# Patient Record
Sex: Female | Born: 1960
Health system: Southern US, Community
[De-identification: ages and names within clinical notes are randomized; demographics above are authoritative.]

## PROBLEM LIST (undated history)

## (undated) DIAGNOSIS — I10 Essential (primary) hypertension: Secondary | ICD-10-CM

## (undated) DIAGNOSIS — F32A Depression, unspecified: Secondary | ICD-10-CM

## (undated) DIAGNOSIS — R0683 Snoring: Secondary | ICD-10-CM

## (undated) DIAGNOSIS — T8859XA Other complications of anesthesia, initial encounter: Secondary | ICD-10-CM

## (undated) DIAGNOSIS — F329 Major depressive disorder, single episode, unspecified: Secondary | ICD-10-CM

## (undated) DIAGNOSIS — T4145XA Adverse effect of unspecified anesthetic, initial encounter: Secondary | ICD-10-CM

## (undated) HISTORY — PX: BREAST BIOPSY: SHX20

## (undated) HISTORY — PX: TONSILLECTOMY AND ADENOIDECTOMY: SUR1326

## (undated) HISTORY — PX: I & D EXTREMITY: SHX5045

## (undated) HISTORY — DX: Essential (primary) hypertension: I10

## (undated) HISTORY — PX: BREAST CYST ASPIRATION: SHX578

---

## 1997-09-01 ENCOUNTER — Ambulatory Visit (HOSPITAL_COMMUNITY): Admission: RE | Admit: 1997-09-01 | Discharge: 1997-09-01 | Payer: Self-pay | Admitting: Obstetrics and Gynecology

## 1997-10-31 ENCOUNTER — Inpatient Hospital Stay (HOSPITAL_COMMUNITY): Admission: AD | Admit: 1997-10-31 | Discharge: 1997-10-31 | Payer: Self-pay | Admitting: Obstetrics and Gynecology

## 1997-11-04 ENCOUNTER — Ambulatory Visit (HOSPITAL_COMMUNITY): Admission: RE | Admit: 1997-11-04 | Discharge: 1997-11-04 | Payer: Self-pay | Admitting: Obstetrics and Gynecology

## 1997-12-23 ENCOUNTER — Ambulatory Visit (HOSPITAL_COMMUNITY): Admission: RE | Admit: 1997-12-23 | Discharge: 1997-12-23 | Payer: Self-pay | Admitting: Obstetrics and Gynecology

## 1997-12-23 ENCOUNTER — Ambulatory Visit (HOSPITAL_COMMUNITY): Admission: RE | Admit: 1997-12-23 | Discharge: 1997-12-23 | Payer: Self-pay | Admitting: Obstetrics & Gynecology

## 1998-01-13 ENCOUNTER — Encounter: Payer: Self-pay | Admitting: Obstetrics and Gynecology

## 1998-01-13 ENCOUNTER — Inpatient Hospital Stay (HOSPITAL_COMMUNITY): Admission: AD | Admit: 1998-01-13 | Discharge: 1998-01-20 | Payer: Self-pay | Admitting: Obstetrics and Gynecology

## 1998-02-27 ENCOUNTER — Other Ambulatory Visit: Admission: RE | Admit: 1998-02-27 | Discharge: 1998-02-27 | Payer: Self-pay | Admitting: Obstetrics and Gynecology

## 1998-12-29 ENCOUNTER — Encounter: Payer: Self-pay | Admitting: Obstetrics and Gynecology

## 1998-12-29 ENCOUNTER — Ambulatory Visit (HOSPITAL_COMMUNITY): Admission: RE | Admit: 1998-12-29 | Discharge: 1998-12-29 | Payer: Self-pay | Admitting: Obstetrics and Gynecology

## 1999-11-24 ENCOUNTER — Encounter: Payer: Self-pay | Admitting: Gastroenterology

## 1999-11-24 ENCOUNTER — Encounter: Admission: RE | Admit: 1999-11-24 | Discharge: 1999-11-24 | Payer: Self-pay | Admitting: Gastroenterology

## 2000-03-21 ENCOUNTER — Encounter: Admission: RE | Admit: 2000-03-21 | Discharge: 2000-06-19 | Payer: Self-pay | Admitting: Obstetrics and Gynecology

## 2000-06-15 ENCOUNTER — Other Ambulatory Visit: Admission: RE | Admit: 2000-06-15 | Discharge: 2000-06-15 | Payer: Self-pay | Admitting: Obstetrics and Gynecology

## 2001-07-06 ENCOUNTER — Other Ambulatory Visit: Admission: RE | Admit: 2001-07-06 | Discharge: 2001-07-06 | Payer: Self-pay | Admitting: Obstetrics and Gynecology

## 2001-12-03 ENCOUNTER — Ambulatory Visit (HOSPITAL_COMMUNITY): Admission: RE | Admit: 2001-12-03 | Discharge: 2001-12-03 | Payer: Self-pay | Admitting: Family Medicine

## 2001-12-03 ENCOUNTER — Encounter: Payer: Self-pay | Admitting: Family Medicine

## 2002-07-02 ENCOUNTER — Other Ambulatory Visit: Admission: RE | Admit: 2002-07-02 | Discharge: 2002-07-02 | Payer: Self-pay | Admitting: Obstetrics and Gynecology

## 2002-07-18 ENCOUNTER — Encounter: Payer: Self-pay | Admitting: Obstetrics and Gynecology

## 2002-07-18 ENCOUNTER — Ambulatory Visit (HOSPITAL_COMMUNITY): Admission: RE | Admit: 2002-07-18 | Discharge: 2002-07-18 | Payer: Self-pay | Admitting: Obstetrics and Gynecology

## 2003-07-04 ENCOUNTER — Other Ambulatory Visit: Admission: RE | Admit: 2003-07-04 | Discharge: 2003-07-04 | Payer: Self-pay | Admitting: Obstetrics and Gynecology

## 2004-08-20 ENCOUNTER — Other Ambulatory Visit: Admission: RE | Admit: 2004-08-20 | Discharge: 2004-08-20 | Payer: Self-pay | Admitting: Obstetrics and Gynecology

## 2005-06-22 ENCOUNTER — Encounter: Admission: RE | Admit: 2005-06-22 | Discharge: 2005-06-22 | Payer: Self-pay | Admitting: Obstetrics and Gynecology

## 2005-09-06 ENCOUNTER — Other Ambulatory Visit: Admission: RE | Admit: 2005-09-06 | Discharge: 2005-09-06 | Payer: Self-pay | Admitting: Obstetrics and Gynecology

## 2005-12-12 ENCOUNTER — Encounter: Admission: RE | Admit: 2005-12-12 | Discharge: 2005-12-12 | Payer: Self-pay | Admitting: Obstetrics and Gynecology

## 2008-08-01 ENCOUNTER — Encounter: Admission: RE | Admit: 2008-08-01 | Discharge: 2008-08-01 | Payer: Self-pay | Admitting: Family Medicine

## 2011-03-22 ENCOUNTER — Other Ambulatory Visit: Payer: Self-pay | Admitting: Obstetrics and Gynecology

## 2011-03-22 DIAGNOSIS — R928 Other abnormal and inconclusive findings on diagnostic imaging of breast: Secondary | ICD-10-CM

## 2011-04-01 ENCOUNTER — Ambulatory Visit
Admission: RE | Admit: 2011-04-01 | Discharge: 2011-04-01 | Disposition: A | Discharge status: Home / Self Care | Payer: Managed Care, Other (non HMO) | Source: Ambulatory Visit | Attending: Obstetrics and Gynecology | Admitting: Obstetrics and Gynecology

## 2011-04-01 DIAGNOSIS — R928 Other abnormal and inconclusive findings on diagnostic imaging of breast: Secondary | ICD-10-CM

## 2011-05-31 HISTORY — PX: HERNIA REPAIR: SHX51

## 2012-01-17 ENCOUNTER — Encounter (INDEPENDENT_AMBULATORY_CARE_PROVIDER_SITE_OTHER): Payer: Self-pay | Admitting: Surgery

## 2012-01-17 ENCOUNTER — Ambulatory Visit (INDEPENDENT_AMBULATORY_CARE_PROVIDER_SITE_OTHER): Payer: Managed Care, Other (non HMO) | Admitting: Surgery

## 2012-01-17 VITALS — BP 138/70 | HR 64 | Temp 97.0°F | Resp 20 | Ht 60.5 in | Wt 259.4 lb

## 2012-01-17 DIAGNOSIS — F329 Major depressive disorder, single episode, unspecified: Secondary | ICD-10-CM | POA: Insufficient documentation

## 2012-01-17 DIAGNOSIS — I10 Essential (primary) hypertension: Secondary | ICD-10-CM | POA: Insufficient documentation

## 2012-01-17 DIAGNOSIS — K429 Umbilical hernia without obstruction or gangrene: Secondary | ICD-10-CM

## 2012-01-17 DIAGNOSIS — K439 Ventral hernia without obstruction or gangrene: Secondary | ICD-10-CM | POA: Insufficient documentation

## 2012-01-17 DIAGNOSIS — F32A Depression, unspecified: Secondary | ICD-10-CM | POA: Insufficient documentation

## 2012-01-17 NOTE — Patient Instructions (Addendum)
See the Handout(s) we gave you.  Consider surgery.  Please call our office at [phone] if you wish to schedule surgery or if you have further questions / concerns.    Hernia A hernia occurs when an internal organ pushes out through a weak spot in the abdominal wall. Hernias most commonly occur in the groin and around the navel. Hernias often can be pushed back into place (reduced). Most hernias tend to get worse over time. Some abdominal hernias can get stuck in the opening (irreducible or incarcerated hernia) and cannot be reduced. An irreducible abdominal hernia which is tightly squeezed into the opening is at risk for impaired blood supply (strangulated hernia). A strangulated hernia is a medical emergency. Because of the risk for an irreducible or strangulated hernia, surgery may be recommended to repair a hernia. CAUSES   Heavy lifting.   Prolonged coughing.   Straining to have a bowel movement.   A cut (incision) made during an abdominal surgery.  HOME CARE INSTRUCTIONS   Bed rest is not required. You may continue your normal activities.   Avoid lifting more than 10 pounds (4.5 kg) or straining.   Cough gently. If you are a smoker it is best to stop. Even the best hernia repair can break down with the continual strain of coughing. Even if you do not have your hernia repaired, a cough will continue to aggravate the problem.   Do not wear anything tight over your hernia. Do not try to keep it in with an outside bandage or truss. These can damage abdominal contents if they are trapped within the hernia sac.   Eat a normal diet.   Avoid constipation. Straining over long periods of time will increase hernia size and encourage breakdown of repairs. If you cannot do this with diet alone, stool softeners may be used.  SEEK IMMEDIATE MEDICAL CARE IF:   You have a fever.   You develop increasing abdominal pain.   You feel nauseous or vomit.   Your hernia is stuck outside the  abdomen, looks discolored, feels hard, or is tender.   You have any changes in your bowel habits or in the hernia that are unusual for you.   You have increased pain or swelling around the hernia.   You cannot push the hernia back in place by applying gentle pressure while lying down.  MAKE SURE YOU:   Understand these instructions.   Will watch your condition.   Will get help right away if you are not doing well or get worse.  Document Released: 05/16/2005 Document Revised: 05/05/2011 Document Reviewed: 01/03/2008 [employer] Patient Information 2012 [employer]. 

## 2012-01-17 NOTE — Progress Notes (Signed)
Subjective:     Patient ID: Colleen Henry, female   DOB: 01-15-1961, 51 y.o.   MRN: [id number]  HPI  Colleen Henry  01-27-1961 [id number]  Patient Care Team: Milus Height, PA as PCP - General (Nurse Practitioner) Turner Daniels, MD as Consulting Physician (Obstetrics and Gynecology)  This patient is a 51 y.o.female who presents today for surgical evaluation at the request of Dr. Rana Snare  Reason for visit: Hernia near her bellybutton.  Pleasant morbidly obese borderline diabetic female has had a lump at her bellybutton enlarging over the past two years.  It is now stalk.  It is now more sensitive especially with activity.  She is trying to lose weight and walks [employer] regularly.  However the soreness limits her activity affiliated exercise.  She's interested in getting it repaired.  She mention it to her gynecologist.  He confirmed hernia and sent the patient for evaluation.  She does have some itchy skin which he scratches at with some occasional scabs.  However active infections.  She did have a C-section in 199.  She recalls having fluid had to be drained out and improved with some Keflex pills Which sounds to me like a postoperative wound infection.    Patient Active Problem List  Diagnosis  . Umbilical hernia  . Diabetes mellitus type II  . HTN (hypertension)  . Depression  . Obesity, Class III, BMI 40-49.9 (morbid obesity)    Past Medical History  Diagnosis Date  . Diabetes mellitus   . Hypertension     Past Surgical History  Procedure Date  . Tonsillectomy and adenoidectomy age 52  . Cesarean section 01/16/1998  . I&d extremity     History   Social History  . Marital Status: Married    Spouse Name: N/A    Number of Children: N/A  . Years of Education: N/A   Occupational History  . Not on file.   Social History Main Topics  . Smoking status: Never Smoker   . Smokeless tobacco: Never Used  . Alcohol Use: Yes     occasionally - socially  . Drug Use: No  .  Sexually Active: Not on file   Other Topics Concern  . Not on file   Social History Narrative  . No narrative on file    History reviewed. No pertinent family history.  Current Outpatient Prescriptions  Medication Sig Dispense Refill  . glimepiride (AMARYL) 2 MG tablet Take 2 mg by mouth daily before breakfast.      . lisinopril-hydrochlorothiazide (PRINZIDE,ZESTORETIC) 10-12.5 MG per tablet Take 1 tablet by mouth daily.      . metFORMIN (GLUCOPHAGE) 500 MG tablet Take 500 mg by mouth 2 (two) times daily with a meal.      . sertraline (ZOLOFT) 50 MG tablet Take 50 mg by mouth daily.         Allergies  Allergen Reactions  . Penicillins Hives    BP 138/70  Pulse 64  Temp 97 F (36.1 C) (Temporal)  Resp 20  Ht 5' 0.5" (1.537 m)  Wt 259 lb 6.4 oz (117.663 kg)  BMI 49.83 kg/m2  No results found.   Review of Systems  Constitutional: Negative for fever, chills, diaphoresis, appetite change and fatigue.  HENT: Negative for ear pain, sore throat, trouble swallowing, neck pain and ear discharge.   Eyes: Negative for photophobia, discharge and visual disturbance.  Respiratory: Negative for cough, choking, chest tightness and shortness of breath.   Cardiovascular: Negative for  chest pain and palpitations.       Patient walks 1-2 miles without difficulty.  No exertional chest/neck/shoulder/arm pain.   Gastrointestinal: Positive for abdominal pain. Negative for nausea, vomiting, diarrhea, constipation, anal bleeding and rectal pain.  Genitourinary: Negative for dysuria, frequency and difficulty urinating.  Musculoskeletal: Negative for myalgias and gait problem.  Skin: Positive for wound. Negative for color change, pallor and rash.       Scratches at many arm scabs  Neurological: Negative for dizziness, speech difficulty, weakness and numbness.  Hematological: Negative for adenopathy.  Psychiatric/Behavioral: Negative for confusion and agitation. The patient is not  nervous/anxious.        Objective:   Physical Exam  Constitutional: She is oriented to person, place, and time. She appears well-developed and well-nourished. No distress.  HENT:  Head: Normocephalic.  Mouth/Throat: Oropharynx is clear and moist. No oropharyngeal exudate.  Eyes: Conjunctivae and EOM are normal. Pupils are equal, round, and reactive to light. No scleral icterus.  Neck: Normal range of motion. Neck supple. No tracheal deviation present.  Cardiovascular: Normal rate, regular rhythm and intact distal pulses.   Pulmonary/Chest: Effort normal and breath sounds normal. No respiratory distress. She exhibits no tenderness.  Abdominal: Soft. She exhibits no distension and no mass. There is tenderness. Hernia confirmed negative in the right inguinal area and confirmed negative in the left inguinal area.    Genitourinary: No vaginal discharge found.  Musculoskeletal: Normal range of motion. She exhibits no tenderness.  Lymphadenopathy:    She has no cervical adenopathy.       Right: No inguinal adenopathy present.       Left: No inguinal adenopathy present.  Neurological: She is alert and oriented to person, place, and time. No cranial nerve deficit. She exhibits normal muscle tone. Coordination normal.  Skin: Skin is warm and dry. No rash noted. She is not diaphoretic. No erythema.  Psychiatric: She has a normal mood and affect. Her behavior is normal. Judgment and thought content normal.       Assessment:     Incarcerated umbilical VWH    Plan:     Repair.  Given her prior lower abdominal surgery and morbid obesity, I think she would benefit from laparoscopic expiration.  I would plan a larger piece of mesh to decrease recurrence with her diabetes and morbid obesity  The anatomy & physiology of the abdominal wall was discussed.  The pathophysiology of hernias was discussed.  Natural history risks without surgery including progeressive enlargement, pain, incarceration &  strangulation was discussed.   Contributors to complications such as smoking, obesity, diabetes, prior surgery, etc were discussed.   I feel the risks of no intervention will lead to serious problems that outweigh the operative risks; therefore, I recommended surgery to reduce and repair the hernia.  I explained laparoscopic techniques with possible need for an open approach.  I noted the probable use of mesh to patch and/or buttress the hernia repair  Risks such as bleeding, infection, abscess, need for further treatment, heart attack, death, and other risks were discussed.  I noted a good likelihood this will help address the problem.   Goals of post-operative recovery were discussed as well.  Possibility that this will not correct all symptoms was explained.  I stressed the importance of low-impact activity, aggressive pain control, avoiding constipation, & not pushing through pain to minimize risk of post-operative chronic pain or injury. Possibility of reherniation especially with smoking, obesity, diabetes, immunosuppression, and other health conditions was  discussed.  We will work to minimize complications.     An educational handout further explaining the pathology & treatment options was given as well.  Questions were answered.  The patient expresses understanding & wishes to proceed with surgery.

## 2012-03-28 ENCOUNTER — Other Ambulatory Visit: Payer: Self-pay | Admitting: Obstetrics and Gynecology

## 2012-03-28 DIAGNOSIS — R928 Other abnormal and inconclusive findings on diagnostic imaging of breast: Secondary | ICD-10-CM

## 2012-04-03 ENCOUNTER — Ambulatory Visit
Admission: RE | Admit: 2012-04-03 | Discharge: 2012-04-03 | Disposition: A | Discharge status: Home / Self Care | Payer: Private Health Insurance - Indemnity | Source: Ambulatory Visit | Attending: Obstetrics and Gynecology | Admitting: Obstetrics and Gynecology

## 2012-04-03 DIAGNOSIS — R928 Other abnormal and inconclusive findings on diagnostic imaging of breast: Secondary | ICD-10-CM

## 2012-04-11 ENCOUNTER — Encounter (HOSPITAL_COMMUNITY): Payer: Self-pay | Admitting: Pharmacist

## 2012-04-16 ENCOUNTER — Encounter (HOSPITAL_COMMUNITY)
Admission: RE | Admit: 2012-04-16 | Discharge: 2012-04-16 | Disposition: A | Discharge status: Home / Self Care | Payer: Private Health Insurance - Indemnity | Source: Ambulatory Visit | Attending: Surgery | Admitting: Surgery

## 2012-04-16 ENCOUNTER — Ambulatory Visit (HOSPITAL_COMMUNITY)
Admission: RE | Admit: 2012-04-16 | Discharge: 2012-04-16 | Disposition: A | Discharge status: Home / Self Care | Payer: Private Health Insurance - Indemnity | Source: Ambulatory Visit | Attending: Anesthesiology | Admitting: Anesthesiology

## 2012-04-16 ENCOUNTER — Encounter (HOSPITAL_COMMUNITY): Payer: Self-pay

## 2012-04-16 DIAGNOSIS — E119 Type 2 diabetes mellitus without complications: Secondary | ICD-10-CM | POA: Insufficient documentation

## 2012-04-16 DIAGNOSIS — K439 Ventral hernia without obstruction or gangrene: Secondary | ICD-10-CM | POA: Insufficient documentation

## 2012-04-16 DIAGNOSIS — I10 Essential (primary) hypertension: Secondary | ICD-10-CM | POA: Insufficient documentation

## 2012-04-16 HISTORY — DX: Other complications of anesthesia, initial encounter: T88.59XA

## 2012-04-16 HISTORY — DX: Adverse effect of unspecified anesthetic, initial encounter: T41.45XA

## 2012-04-16 LAB — BASIC METABOLIC PANEL
BUN: 14 mg/dL (ref 6–23)
Calcium: 9.7 mg/dL (ref 8.4–10.5)
GFR calc Af Amer: >90 mL/min (ref >90)
GFR calc non Af Amer: >90 mL/min (ref >90)
Glucose, Bld: 100 mg/dL — ABNORMAL HIGH (ref 70–99)
Potassium: 3.9 mEq/L (ref 3.5–5.1)
Sodium: 139 mEq/L (ref 135–145)

## 2012-04-16 LAB — CBC
Hemoglobin: 14.2 g/dL (ref 12.0–15.0)
MCH: 29.2 pg (ref 26.0–34.0)
MCHC: 32.4 g/dL (ref 30.0–36.0)
Platelets: 265 10*3/uL (ref 150–400)

## 2012-04-16 LAB — SURGICAL PCR SCREEN: MRSA, PCR: NEGATIVE

## 2012-04-16 MED ORDER — MUPIROCIN 2 % EX OINT: TOPICAL_OINTMENT | Freq: Two times a day (BID) | CUTANEOUS | Status: DC

## 2012-04-16 MED ORDER — CHLORHEXIDINE GLUCONATE 4 % EX LIQD: 1.0000 "application " | Freq: Once | CUTANEOUS | Status: DC

## 2012-04-16 NOTE — Pre-Procedure Instructions (Signed)
20 Colleen Henry  04/16/2012   Your procedure is scheduled on:  Friday  April 20, 2012  Report to [facility] at 0530 am  AM.  Call this number if you have problems the morning of surgery: [phone]   Remember:   Do not eat food or drink anything :After Midnight.    Take these medicines the morning of surgery with A SIP OF WATER: zoloft .   Do not wear jewelry, make-up or nail polish.  Do not wear lotions, powders, or perfumes. You may wear deodorant.  Do not shave 48 hours prior to surgery. Men may shave face and neck.  Do not bring valuables to the hospital.  Contacts, dentures or bridgework may not be worn into surgery.  Leave suitcase in the car. After surgery it may be brought to your room.  For patients admitted to the hospital, checkout time is 11:00 AM the day of discharge.   Patients discharged the day of surgery will not be allowed to drive home.  Name and phone number of your driver: Colleen Henry cell [phone] husband  Special Instructions: Shower using CHG 2 nights before surgery and the night before surgery.  If you shower the day of surgery use CHG.  Use special wash - you have one bottle of CHG for all showers.  You should use approximately 1/3 of the bottle for each shower.   Please read over the following fact sheets that you were given: Pain Booklet, Coughing and Deep Breathing, Lab Information, MRSA Information and Surgical Site Infection Prevention

## 2012-04-17 NOTE — Consult Note (Signed)
Anesthesia chart review: Patient is a 51 year old female scheduled for umbilical hernia repair by Dr. Michaell Cowing on 04/20/2012. History includes morbid obesity, nonsmoker, hypertension, diabetes mellitus type 2, tonsillectomy, prior cesarean section. She reported a history of waking up agitated from anesthesia at the child.  PCP is Dr. Milus Height, PA-C.  GYN is Dr. Rana Snare.  EKG on 04/16/2012 showed sinus bradycardia at 59 beats per minute, possible left atrial enlargement.  Chest x-ray report on 04/16/2012 showed: Cardiac size at the upper limits of normal to mildly  enlarged. Other mediastinal contours are within normal limits. Visualized tracheal air column is within normal limits. Mild elevation of the right hemidiaphragm. No pneumothorax, pulmonary edema, pleural effusion or confluent pulmonary opacity. No acute osseous abnormality identified.  Preoperative CBC and BMET noted.  LMP is listed as 03/23/12.  She will need a serum pregnancy test on arrival.  Shonna Chock, [place] 04/17/12 1112

## 2012-04-20 ENCOUNTER — Ambulatory Visit (HOSPITAL_COMMUNITY)
Admission: RE | Admit: 2012-04-20 | Discharge: 2012-04-20 | Disposition: A | Discharge status: Home / Self Care | Payer: Private Health Insurance - Indemnity | Source: Ambulatory Visit | Attending: Surgery | Admitting: Surgery

## 2012-04-20 ENCOUNTER — Encounter (HOSPITAL_COMMUNITY): Payer: Self-pay | Admitting: Vascular Surgery

## 2012-04-20 ENCOUNTER — Encounter (HOSPITAL_COMMUNITY): Payer: Self-pay | Admitting: Surgery

## 2012-04-20 ENCOUNTER — Ambulatory Visit (HOSPITAL_COMMUNITY): Payer: Private Health Insurance - Indemnity | Admitting: Vascular Surgery

## 2012-04-20 ENCOUNTER — Encounter (HOSPITAL_COMMUNITY)
Admission: RE | Disposition: A | Discharge status: Home / Self Care | Payer: Self-pay | Source: Ambulatory Visit | Attending: Surgery

## 2012-04-20 DIAGNOSIS — K436 Other and unspecified ventral hernia with obstruction, without gangrene: Secondary | ICD-10-CM | POA: Insufficient documentation

## 2012-04-20 DIAGNOSIS — F3289 Other specified depressive episodes: Secondary | ICD-10-CM | POA: Insufficient documentation

## 2012-04-20 DIAGNOSIS — K439 Ventral hernia without obstruction or gangrene: Secondary | ICD-10-CM | POA: Diagnosis present

## 2012-04-20 DIAGNOSIS — F329 Major depressive disorder, single episode, unspecified: Secondary | ICD-10-CM | POA: Insufficient documentation

## 2012-04-20 DIAGNOSIS — I10 Essential (primary) hypertension: Secondary | ICD-10-CM | POA: Diagnosis present

## 2012-04-20 DIAGNOSIS — Z6841 Body Mass Index (BMI) 40.0 and over, adult: Secondary | ICD-10-CM | POA: Insufficient documentation

## 2012-04-20 DIAGNOSIS — E119 Type 2 diabetes mellitus without complications: Secondary | ICD-10-CM | POA: Insufficient documentation

## 2012-04-20 DIAGNOSIS — E66813 Obesity, class 3: Secondary | ICD-10-CM | POA: Diagnosis present

## 2012-04-20 DIAGNOSIS — Z01812 Encounter for preprocedural laboratory examination: Secondary | ICD-10-CM | POA: Insufficient documentation

## 2012-04-20 HISTORY — PX: VENTRAL HERNIA REPAIR: SHX424

## 2012-04-20 HISTORY — PX: INSERTION OF MESH: SHX5868

## 2012-04-20 LAB — HCG, SERUM, QUALITATIVE: Preg, Serum: NEGATIVE

## 2012-04-20 LAB — GLUCOSE, CAPILLARY: Glucose-Capillary: 107 mg/dL — ABNORMAL HIGH (ref 70–99)

## 2012-04-20 SURGERY — REPAIR, HERNIA, VENTRAL, LAPAROSCOPIC
Anesthesia: General | Site: Abdomen | Wound class: Clean

## 2012-04-20 MED ORDER — GENTAMICIN IN SALINE 1-0.9 MG/ML-% IV SOLN
100.0000 mg | INTRAVENOUS | Status: DC
  Filled 2012-04-20: qty 100

## 2012-04-20 MED ORDER — GLIMEPIRIDE 2 MG PO TABS: 2.0000 mg | ORAL_TABLET | Freq: Every day | ORAL | Status: DC

## 2012-04-20 MED ORDER — HYDROMORPHONE HCL PF 1 MG/ML IJ SOLN
INTRAMUSCULAR | Status: AC
  Administered 2012-04-20: 0.5 mg via INTRAVENOUS
  Filled 2012-04-20: qty 1

## 2012-04-20 MED ORDER — NEOSTIGMINE METHYLSULFATE 1 MG/ML IJ SOLN
INTRAMUSCULAR | Status: DC | PRN
  Administered 2012-04-20: 4 mg via INTRAVENOUS
  Administered 2012-04-20: 1 mg via INTRAVENOUS

## 2012-04-20 MED ORDER — LACTATED RINGERS IV SOLN
INTRAVENOUS | Status: DC | PRN
  Administered 2012-04-20: 07:00:00 via INTRAVENOUS

## 2012-04-20 MED ORDER — LACTATED RINGERS IV BOLUS (SEPSIS): 1000.0000 mL | Freq: Three times a day (TID) | INTRAVENOUS | Status: DC | PRN

## 2012-04-20 MED ORDER — CLINDAMYCIN PHOSPHATE 600 MG/50ML IV SOLN
INTRAVENOUS | Status: AC
  Filled 2012-04-20: qty 50

## 2012-04-20 MED ORDER — PROPOFOL 10 MG/ML IV BOLUS
INTRAVENOUS | Status: DC | PRN
  Administered 2012-04-20: 200 mg via INTRAVENOUS

## 2012-04-20 MED ORDER — NAPROXEN 500 MG PO TABS: 500.0000 mg | ORAL_TABLET | Freq: Two times a day (BID) | ORAL | Status: DC

## 2012-04-20 MED ORDER — BUPIVACAINE-EPINEPHRINE 0.25% -1:200000 IJ SOLN
INTRAMUSCULAR | Status: DC | PRN
  Administered 2012-04-20: 30 mL

## 2012-04-20 MED ORDER — LIDOCAINE HCL (CARDIAC) 20 MG/ML IV SOLN
INTRAVENOUS | Status: DC | PRN
  Administered 2012-04-20: 50 mg via INTRAVENOUS

## 2012-04-20 MED ORDER — OXYCODONE HCL 5 MG/5ML PO SOLN: 5.0000 mg | Freq: Once | ORAL | Status: DC | PRN

## 2012-04-20 MED ORDER — ONDANSETRON HCL 4 MG/2ML IJ SOLN
INTRAMUSCULAR | Status: DC | PRN
  Administered 2012-04-20: 4 mg via INTRAVENOUS

## 2012-04-20 MED ORDER — BUPIVACAINE-EPINEPHRINE PF 0.25-1:200000 % IJ SOLN
INTRAMUSCULAR | Status: AC
  Filled 2012-04-20: qty 30

## 2012-04-20 MED ORDER — SERTRALINE HCL 50 MG PO TABS: 50.0000 mg | ORAL_TABLET | Freq: Every day | ORAL | Status: DC

## 2012-04-20 MED ORDER — ONDANSETRON HCL 4 MG/2ML IJ SOLN: 4.0000 mg | Freq: Four times a day (QID) | INTRAMUSCULAR | Status: DC | PRN

## 2012-04-20 MED ORDER — BUPIVACAINE-EPINEPHRINE PF 0.25-1:200000 % IJ SOLN
INTRAMUSCULAR | Status: DC | PRN
  Administered 2012-04-20: 30 mL

## 2012-04-20 MED ORDER — LISINOPRIL-HYDROCHLOROTHIAZIDE 10-12.5 MG PO TABS: 1.0000 | ORAL_TABLET | Freq: Every day | ORAL | Status: DC

## 2012-04-20 MED ORDER — LIP MEDEX EX OINT: 1.0000 "application " | TOPICAL_OINTMENT | Freq: Two times a day (BID) | CUTANEOUS | Status: DC

## 2012-04-20 MED ORDER — FENTANYL CITRATE 0.05 MG/ML IJ SOLN
INTRAMUSCULAR | Status: DC | PRN
  Administered 2012-04-20 (×2): 50 ug via INTRAVENOUS
  Administered 2012-04-20: 100 ug via INTRAVENOUS
  Administered 2012-04-20: 50 ug via INTRAVENOUS

## 2012-04-20 MED ORDER — ROCURONIUM BROMIDE 100 MG/10ML IV SOLN
INTRAVENOUS | Status: DC | PRN
  Administered 2012-04-20: 50 mg via INTRAVENOUS

## 2012-04-20 MED ORDER — OXYCODONE HCL 5 MG PO TABS: 5.0000 mg | ORAL_TABLET | Freq: Once | ORAL | Status: DC | PRN

## 2012-04-20 MED ORDER — OXYCODONE HCL 5 MG PO TABS: 5.0000 mg | ORAL_TABLET | ORAL | Status: DC | PRN

## 2012-04-20 MED ORDER — METFORMIN HCL 500 MG PO TABS: 500.0000 mg | ORAL_TABLET | Freq: Every day | ORAL | Status: DC

## 2012-04-20 MED ORDER — HYDROMORPHONE HCL PF 1 MG/ML IJ SOLN
0.2500 mg | INTRAMUSCULAR | Status: DC | PRN
  Administered 2012-04-20 (×2): 0.5 mg via INTRAVENOUS

## 2012-04-20 MED ORDER — SODIUM CHLORIDE 0.9 % IJ SOLN: 3.0000 mL | Freq: Two times a day (BID) | INTRAMUSCULAR | Status: DC

## 2012-04-20 MED ORDER — MIDAZOLAM HCL 5 MG/5ML IJ SOLN
INTRAMUSCULAR | Status: DC | PRN
  Administered 2012-04-20 (×2): 1 mg via INTRAVENOUS

## 2012-04-20 MED ORDER — EPHEDRINE SULFATE 50 MG/ML IJ SOLN
INTRAMUSCULAR | Status: DC | PRN
  Administered 2012-04-20: 5 mg via INTRAVENOUS

## 2012-04-20 MED ORDER — MEPERIDINE HCL 25 MG/ML IJ SOLN: 6.2500 mg | INTRAMUSCULAR | Status: DC | PRN

## 2012-04-20 MED ORDER — SODIUM CHLORIDE 0.9 % IJ SOLN: 3.0000 mL | INTRAMUSCULAR | Status: DC | PRN

## 2012-04-20 MED ORDER — GLYCOPYRROLATE 0.2 MG/ML IJ SOLN
INTRAMUSCULAR | Status: DC | PRN
  Administered 2012-04-20: 0.2 mg via INTRAVENOUS
  Administered 2012-04-20: .6 mg via INTRAVENOUS

## 2012-04-20 MED ORDER — FENTANYL CITRATE 0.05 MG/ML IJ SOLN: 25.0000 ug | INTRAMUSCULAR | Status: DC | PRN

## 2012-04-20 MED ORDER — PROMETHAZINE HCL 25 MG/ML IJ SOLN: 6.2500 mg | INTRAMUSCULAR | Status: DC | PRN

## 2012-04-20 MED ORDER — ACETAMINOPHEN 650 MG RE SUPP: 650.0000 mg | RECTAL | Status: DC | PRN

## 2012-04-20 MED ORDER — CHLORHEXIDINE GLUCONATE 4 % EX LIQD: 1.0000 "application " | Freq: Once | CUTANEOUS | Status: DC

## 2012-04-20 MED ORDER — PROMETHAZINE HCL 25 MG/ML IJ SOLN: 12.5000 mg | Freq: Four times a day (QID) | INTRAMUSCULAR | Status: DC | PRN

## 2012-04-20 MED ORDER — SODIUM CHLORIDE 0.9 % IR SOLN
Status: DC | PRN
  Administered 2012-04-20: 1000 mL

## 2012-04-20 MED ORDER — ARTIFICIAL TEARS OP OINT
TOPICAL_OINTMENT | OPHTHALMIC | Status: DC | PRN
  Administered 2012-04-20: 1 via OPHTHALMIC

## 2012-04-20 MED ORDER — CLINDAMYCIN PHOSPHATE 600 MG/50ML IV SOLN
600.0000 mg | INTRAVENOUS | Status: AC
  Administered 2012-04-20: 600 mg via INTRAVENOUS
  Filled 2012-04-20: qty 50

## 2012-04-20 MED ORDER — GENTAMICIN IN SALINE 1-0.9 MG/ML-% IV SOLN
100.0000 mg | INTRAVENOUS | Status: AC
  Administered 2012-04-20: 100 mg via INTRAVENOUS
  Filled 2012-04-20: qty 100

## 2012-04-20 MED ORDER — KETOROLAC TROMETHAMINE 30 MG/ML IJ SOLN
INTRAMUSCULAR | Status: DC | PRN
  Administered 2012-04-20: 30 mg via INTRAVENOUS

## 2012-04-20 MED ORDER — ACETAMINOPHEN 325 MG PO TABS: 650.0000 mg | ORAL_TABLET | ORAL | Status: DC | PRN

## 2012-04-20 MED ORDER — MAGIC MOUTHWASH: 15.0000 mL | Freq: Four times a day (QID) | ORAL | Status: DC | PRN

## 2012-04-20 MED ORDER — VECURONIUM BROMIDE 10 MG IV SOLR
INTRAVENOUS | Status: DC | PRN
  Administered 2012-04-20: 1 mg via INTRAVENOUS
  Administered 2012-04-20: .5 mg via INTRAVENOUS

## 2012-04-20 MED ORDER — DIPHENHYDRAMINE HCL 50 MG/ML IJ SOLN: 12.5000 mg | Freq: Four times a day (QID) | INTRAMUSCULAR | Status: DC | PRN

## 2012-04-20 MED ORDER — METOPROLOL TARTRATE 12.5 MG HALF TABLET: 12.5000 mg | ORAL_TABLET | Freq: Two times a day (BID) | ORAL | Status: DC | PRN

## 2012-04-20 MED ORDER — SODIUM CHLORIDE 0.9 % IV SOLN: 250.0000 mL | INTRAVENOUS | Status: DC | PRN

## 2012-04-20 MED ORDER — ALUM & MAG HYDROXIDE-SIMETH 200-200-20 MG/5ML PO SUSP: 30.0000 mL | Freq: Four times a day (QID) | ORAL | Status: DC | PRN

## 2012-04-20 SURGICAL SUPPLY — 58 items
APPLICATOR COTTON TIP 6IN STRL (MISCELLANEOUS) IMPLANT
APPLIER CLIP LOGIC TI 5 (MISCELLANEOUS) IMPLANT
BINDER ABD UNIV 12 45-62 (WOUND CARE) ×1 IMPLANT
BINDER ABDOMINAL 46IN 62IN (WOUND CARE) ×2
BLADE SURG ROTATE 9660 (MISCELLANEOUS) IMPLANT
CANISTER SUCTION 2500CC (MISCELLANEOUS) ×2 IMPLANT
CHLORAPREP W/TINT 26ML (MISCELLANEOUS) ×2 IMPLANT
CLOTH BEACON ORANGE TIMEOUT ST (SAFETY) ×2 IMPLANT
COVER SURGICAL LIGHT HANDLE (MISCELLANEOUS) ×2 IMPLANT
DEVICE SECURE STRAP 25 ABSORB (INSTRUMENTS) ×2 IMPLANT
DEVICE TROCAR PUNCTURE CLOSURE (ENDOMECHANICALS) IMPLANT
DRAIN CHANNEL 15F RND FF W/TCR (WOUND CARE) IMPLANT
DRAPE WARM FLUID 44X44 (DRAPE) ×2 IMPLANT
DRSG TEGADERM 4X4.75 (GAUZE/BANDAGES/DRESSINGS) ×2 IMPLANT
ELECT REM PT RETURN 9FT ADLT (ELECTROSURGICAL) ×2
ELECTRODE REM PT RTRN 9FT ADLT (ELECTROSURGICAL) ×1 IMPLANT
EVACUATOR SILICONE 100CC (DRAIN) IMPLANT
GLOVE BIO SURGEON STRL SZ7 (GLOVE) ×2 IMPLANT
GLOVE BIO SURGEON STRL SZ7.5 (GLOVE) ×2 IMPLANT
GLOVE BIOGEL PI IND STRL 7.0 (GLOVE) ×2 IMPLANT
GLOVE BIOGEL PI IND STRL 7.5 (GLOVE) ×1 IMPLANT
GLOVE BIOGEL PI IND STRL 8 (GLOVE) ×1 IMPLANT
GLOVE BIOGEL PI INDICATOR 7.0 (GLOVE) ×2
GLOVE BIOGEL PI INDICATOR 7.5 (GLOVE) ×1
GLOVE BIOGEL PI INDICATOR 8 (GLOVE) ×1
GLOVE ECLIPSE 8.0 STRL XLNG CF (GLOVE) ×2 IMPLANT
GLOVE SURG SS PI 6.5 STRL IVOR (GLOVE) ×2 IMPLANT
GOWN PREVENTION PLUS XLARGE (GOWN DISPOSABLE) ×2 IMPLANT
GOWN STRL NON-REIN LRG LVL3 (GOWN DISPOSABLE) ×4 IMPLANT
KIT BASIN OR (CUSTOM PROCEDURE TRAY) ×2 IMPLANT
KIT ROOM TURNOVER OR (KITS) ×2 IMPLANT
MESH VENTRALIGHT ST 6X8 (Mesh Specialty) ×1 IMPLANT
MESH VENTRLGHT ELLIPSE 8X6XMFL (Mesh Specialty) ×1 IMPLANT
NEEDLE 22X1 1/2 (OR ONLY) (NEEDLE) ×2 IMPLANT
NEEDLE INSUFFLATION 14GA 120MM (NEEDLE) IMPLANT
NEEDLE SPNL 22GX3.5 QUINCKE BK (NEEDLE) ×2 IMPLANT
NS IRRIG 1000ML POUR BTL (IV SOLUTION) ×2 IMPLANT
PAD ARMBOARD 7.5X6 YLW CONV (MISCELLANEOUS) ×4 IMPLANT
PEN SKIN MARKING BROAD (MISCELLANEOUS) ×2 IMPLANT
SCALPEL HARMONIC ACE (MISCELLANEOUS) ×2 IMPLANT
SCISSORS LAP 5X35 DISP (ENDOMECHANICALS) ×2 IMPLANT
SET IRRIG TUBING LAPAROSCOPIC (IRRIGATION / IRRIGATOR) ×2 IMPLANT
SLEEVE Z-THREAD 5X100MM (TROCAR) ×2 IMPLANT
SPONGE GAUZE 4X4 12PLY (GAUZE/BANDAGES/DRESSINGS) ×2 IMPLANT
STRIP CLOSURE SKIN 1/2X4 (GAUZE/BANDAGES/DRESSINGS) ×4 IMPLANT
SUT MNCRL AB 4-0 PS2 18 (SUTURE) ×2 IMPLANT
SUT PROLENE 1 CT (SUTURE) ×4 IMPLANT
SUT VICRYL 0 UR6 27IN ABS (SUTURE) ×2 IMPLANT
TACKER 5MM HERNIA 3.5CML NAB (ENDOMECHANICALS) IMPLANT
TOWEL OR 17X24 6PK STRL BLUE (TOWEL DISPOSABLE) ×2 IMPLANT
TOWEL OR 17X26 10 PK STRL BLUE (TOWEL DISPOSABLE) ×2 IMPLANT
TRAY FOLEY CATH 14FRSI W/METER (CATHETERS) IMPLANT
TRAY LAPAROSCOPIC (CUSTOM PROCEDURE TRAY) ×2 IMPLANT
TROCAR FALLER TUNNELING (TROCAR) ×2 IMPLANT
TROCAR XCEL NON-BLD 5MMX100MML (ENDOMECHANICALS) ×2 IMPLANT
TROCAR Z-THREAD FIOS 11X100 BL (TROCAR) ×2 IMPLANT
TROCAR Z-THREAD FIOS 5X100MM (TROCAR) ×2 IMPLANT
WATER STERILE IRR 1000ML POUR (IV SOLUTION) IMPLANT

## 2012-04-20 NOTE — Progress Notes (Signed)
Patient still in post op attempting to void. Iv fluids infusing, drinking water and diet coke.

## 2012-04-20 NOTE — Op Note (Signed)
04/20/2012  9:43 AM  PATIENT:  Colleen Henry  51 y.o. female  Patient Care Team: Milus Height, [place] as PCP - General (Nurse Practitioner) Turner Daniels, MD as Consulting Physician (Obstetrics and Gynecology)  PRE-OPERATIVE DIAGNOSIS:  Incarcerated umbilical hernia  POST-OPERATIVE DIAGNOSIS:  Incarcerated ventral wall hernia x2  PROCEDURE:  Procedure(s): LAPAROSCOPIC VENTRAL HERNIA REDUCTION/REPAIR INSERTION OF MESH  SURGEON:  Surgeon(s): Ardeth Sportsman, MD  ASSISTANT: RN   ANESTHESIA:   local and general  EBL:  Total I/O In: 1300 [I.V.:1300] Out: 330 [Urine:305; Blood:25]  Delay start of Pharmacological VTE agent (>24hrs) due to surgical blood loss or risk of bleeding:  no  DRAINS: none   SPECIMEN:  No Specimen  DISPOSITION OF SPECIMEN:  N/A  COUNTS:  YES  PLAN OF CARE: Discharge to home after PACU  PATIENT DISPOSITION:  PACU - hemodynamically stable.  INDICATION: Pleasant patient has developed a ventral wall abdominal hernia.  Incarcerated at umbilicus  Recommendation was made for surgical repair:  The anatomy & physiology of the abdominal wall was discussed. The pathophysiology of hernias was discussed. Natural history risks without surgery including progeressive enlargement, pain, incarceration & strangulation was discussed. Contributors to complications such as smoking, obesity, diabetes, prior surgery, etc were discussed.  I feel the risks of no intervention will lead to serious problems that outweigh the operative risks; therefore, I recommended surgery to reduce and repair the hernia. I explained laparoscopic techniques with possible need for an open approach. I noted the probable use of mesh to patch and/or buttress the hernia repair  Risks such as bleeding, infection, abscess, need for further treatment, heart attack, death, and other risks were discussed. I noted a good likelihood this will help address the problem. Goals of post-operative recovery were  discussed as well. Possibility that this will not correct all symptoms was explained. I stressed the importance of low-impact activity, aggressive pain control, avoiding constipation, & not pushing through pain to minimize risk of post-operative chronic pain or injury. Possibility of reherniation especially with smoking, obesity, diabetes, immunosuppression, and other health conditions was discussed. We will work to minimize complications.  An educational handout further explaining the pathology & treatment options was given as well. Questions were answered. The patient expresses understanding & wishes to proceed with surgery.   OR FINDINGS: periumbilical VWH x2 incarcerated with omentum.  4x3 cm region.  Repaired with a Bard 20x15cm dual sided mesh (polypropylene/Seprafilm)  DESCRIPTION:   Informed consent was confirmed. The patient underwent general anaesthesia without difficulty. The patient was positioned appropriately. VTE prevention in place. The patient's abdomen was clipped, prepped, & draped in a sterile fashion. Surgical timeout confirmed our plan.  The patient was positioned in reverse Trendelenburg. Abdominal entry was gained using optical entry technique in the left upper abdomen. Entry was clean. I induced carbon dioxide insufflation. Camera inspection revealed no injury. Extra ports were carefully placed under direct laparoscopic visualization.   I could see the hernia in the central abdomen.  I did laparoscopic lysis of adhesions to free the edges of the incarceration & reduce the incarcerated omentum.   I primarily used and focused cold scissors & careful blunt reduction.    With that, we did expose the entire anterior abdominal wall. I made sure hemostasis was good.  I mapped out the region using a needle passer.   Supraumbilical & umbilical defects - 4x3cm region.  To ensure that I would have at least 5 cm radial coverage outside of the hernia defect,  I chose a 20x15cm cm Bard "soft  mesh" dual sided mesh.  I placed #1 Prolene stitches around its edge about every 5 cm = 16 total.  I placed a 10mm port through the umbilical defect.  I rolled the mesh & placed into the peritoneal cavity through the hernia defect.  I unrolled  the mesh and positioned it appropriately.  I secured the mesh to cover up the hernia defect using a laparoscopic suture passer to pass the tails of the Prolene through the abdominal wall & tagged them with clamps.  I started out in four corners to make sure I had the mesh centered over the hernia defect appropriately, and then proceeded to work in quadrants.  We evacuated CO2 & desufflated the abdomen.  I tied the fascial stitches down.  I reinsufflated the abdomen.  The mesh provided at least 5-10 cm circumferential coverage around the entire region of hernia defects.   I closed the fascial ventral wall defect using 0 vicryl transverse stitches.    I did reinspection.   Hemostasis was excellent.  I tacked the edges & central part of the mesh with SecureStrap absorbable tacks.     Hemostasis was good. Mesh laid well. Capnoperitoneum was evacuated. Ports were removed. The skin was closed with Monocryl at the port sites and Steri-Strips on the fascial stitch puncture sites.  Patient is being extubated to go to the recovery room. I'm about to discuss operative findings with the patient's family.

## 2012-04-20 NOTE — Anesthesia Postprocedure Evaluation (Signed)
  Anesthesia Post-op Note  Patient: Colleen Henry  Procedure(s) Performed: Procedure(s) (LRB) with comments: LAPAROSCOPIC VENTRAL HERNIA (N/A) - laparoscopic ventral wall hernia repair INSERTION OF MESH (N/A) HERNIA REPAIR VENTRAL ADULT (N/A) - Repair incarcerated ventral hernia times two.  Patient Location: PACU  Anesthesia Type:General  Level of Consciousness: awake  Airway and Oxygen Therapy: Patient Spontanous Breathing  Post-op Pain: mild  Post-op Assessment: Post-op Vital signs reviewed  Post-op Vital Signs: stable  Complications: No apparent anesthesia complications

## 2012-04-20 NOTE — Anesthesia Procedure Notes (Signed)
Procedure Name: Intubation Date/Time: 04/20/2012 7:41 AM Performed by: Gayla Medicus Pre-anesthesia Checklist: Patient identified, Timeout performed, Emergency Drugs available, Suction available and Patient being monitored Patient Re-evaluated:Patient Re-evaluated prior to inductionOxygen Delivery Method: Circle system utilized Preoxygenation: Pre-oxygenation with 100% oxygen Intubation Type: IV induction Ventilation: Mask ventilation without difficulty and Oral airway inserted - appropriate to patient size Laryngoscope Size: Mac and 3 Grade View: Grade II Tube type: Oral Tube size: 7.5 mm Number of attempts: 1 Airway Equipment and Method: Stylet Placement Confirmation: ETT inserted through vocal cords under direct vision,  positive ETCO2 and breath sounds checked- equal and bilateral Secured at: 23 cm Tube secured with: Tape Dental Injury: Teeth and Oropharynx as per pre-operative assessment

## 2012-04-20 NOTE — Anesthesia Preprocedure Evaluation (Signed)
Anesthesia Evaluation    Airway Mallampati: I      Dental  (+) Teeth Intact   Pulmonary neg pulmonary ROS,          Cardiovascular hypertension, Rhythm:Regular Rate:Normal     Neuro/Psych    GI/Hepatic negative GI ROS,   Endo/Other  diabetesMorbid obesity  Renal/GU negative Renal ROS     Musculoskeletal   Abdominal (+) + obese,   Peds  Hematology negative hematology ROS (+)   Anesthesia Other Findings   Reproductive/Obstetrics                           Anesthesia Physical Anesthesia Plan  ASA: II  Anesthesia Plan: General   Post-op Pain Management:    Induction: Intravenous  Airway Management Planned: Oral ETT  Additional Equipment:   Intra-op Plan:   Post-operative Plan: Extubation in OR  Informed Consent:   Dental advisory given  Plan Discussed with: Surgeon  Anesthesia Plan Comments:         Anesthesia Quick Evaluation

## 2012-04-20 NOTE — H&P (Signed)
Colleen Henry  September 18, 1960 [id number]  Patient Care Team: Milus Height, PA as PCP - General (Nurse Practitioner) Turner Daniels, MD as Consulting Physician (Obstetrics and Gynecology)  This patient is a 51 y.o.female who presents today for surgical evaluation at the request of Dr. Rana Snare   Reason for visit: Hernia near her bellybutton.    Pleasant morbidly obese borderline diabetic female has had a lump at her bellybutton enlarging over the past two years. It is now stalk. It is now more sensitive especially with activity. She is trying to lose weight and walks [employer] regularly. However the soreness limits her activity affiliated exercise. She's interested in getting it repaired. She mention it to her gynecologist. He confirmed hernia and sent the patient for evaluation.  She does have some itchy skin which he scratches at with some occasional scabs. However active infections. She did have a C-section in 1999. She recalls having fluid had to be drained out and improved with some Keflex pills which sounds to me like a postoperative wound infection.  No new events  Patient Active Problem List  Diagnosis  . Umbilical hernia  . Diabetes mellitus type II  . HTN (hypertension)  . Depression  . Obesity, Class III, BMI 40-49.9 (morbid obesity)    Past Medical History  Diagnosis Date  . Hypertension   . Complication of anesthesia     agitation upon waking up as a child .   . Diabetes mellitus     oral meds for diabetes . Type 2    Past Surgical History  Procedure Date  . Tonsillectomy and adenoidectomy age 7  . Cesarean section 01/16/1998  . I&d extremity     History   Social History  . Marital Status: Married    Spouse Name: N/A    Number of Children: N/A  . Years of Education: N/A   Occupational History  . Not on file.   Social History Main Topics  . Smoking status: Never Smoker   . Smokeless tobacco: Never Used  . Alcohol Use: Yes     Comment: occasionally - socially    . Drug Use: No  . Sexually Active: Yes   Other Topics Concern  . Not on file   Social History Narrative  . No narrative on file    History reviewed. No pertinent family history.  No current facility-administered medications for this encounter.     Allergies  Allergen Reactions  . Penicillins Hives    Tolerates keflex    BP 125/76  Pulse 64  Temp 98.2 F (36.8 C) (Oral)  Resp 20  SpO2 96%  LMP 03/22/2012  Dg Chest 2 View  04/16/2012  *RADIOLOGY REPORT*  Clinical Data: 51 year old female preoperative study for ventral hernia repair.  Hypertension and diabetes.  CHEST - 2 VIEW  Comparison: None.  Findings: Cardiac size at the upper limits of normal to mildly enlarged. Other mediastinal contours are within normal limits. Visualized tracheal air column is within normal limits.  Mild elevation of the right hemidiaphragm.  No pneumothorax, pulmonary edema, pleural effusion or confluent pulmonary opacity. No acute osseous abnormality identified.  IMPRESSION: No acute cardiopulmonary abnormality.   Original Report Authenticated By: Erskine Speed, M.D.    US Breast Right  04/03/2012  *RADIOLOGY REPORT*  Clinical Data:  Screening callback for questioned right breast mass  DIGITAL DIAGNOSTIC RIGHT MAMMOGRAM WITHOUT CAD AND RIGHT BREAST ULTRASOUND:  Comparison:  Prior exams  Findings:  Additional views demonstrate a circumscribed oval 6  mm mass in the right upper outer quadrant, corresponding to the screening mammographic finding.  On physical exam, I palpate no abnormality in the right upper outer quadrant.  Ultrasound is performed, showing no sonographic correlate to the mammographic finding.  IMPRESSION: No evidence for malignancy, probably benign circumscribed right upper outer quadrant nonpalpable mass.  54-month follow-up right diagnostic mammogram and possibly ultrasound are recommended.  I have discussed the findings and recommendations with the patient. Results were also provided in  writing at the conclusion of the visit.   Original Report Authenticated By: Christiana Pellant, M.D.    Mm Digital Diagnostic Unilat R  04/03/2012  *RADIOLOGY REPORT*  Clinical Data:  Screening callback for questioned right breast mass  DIGITAL DIAGNOSTIC RIGHT MAMMOGRAM WITHOUT CAD AND RIGHT BREAST ULTRASOUND:  Comparison:  Prior exams  Findings:  Additional views demonstrate a circumscribed oval 6 mm mass in the right upper outer quadrant, corresponding to the screening mammographic finding.  On physical exam, I palpate no abnormality in the right upper outer quadrant.  Ultrasound is performed, showing no sonographic correlate to the mammographic finding.  IMPRESSION: No evidence for malignancy, probably benign circumscribed right upper outer quadrant nonpalpable mass.  59-month follow-up right diagnostic mammogram and possibly ultrasound are recommended.  I have discussed the findings and recommendations with the patient. Results were also provided in writing at the conclusion of the visit.   Original Report Authenticated By: Christiana Pellant, M.D.    Review of Systems  Constitutional: Negative for fever, chills, diaphoresis, appetite change and fatigue.  HENT: Negative for ear pain, sore throat, trouble swallowing, neck pain and ear discharge.  Eyes: Negative for photophobia, discharge and visual disturbance.  Respiratory: Negative for cough, choking, chest tightness and shortness of breath.  Cardiovascular: Negative for chest pain and palpitations.  Patient walks 1-2 miles without difficulty. No exertional chest/neck/shoulder/arm pain.  Gastrointestinal: Positive for abdominal pain. Negative for nausea, vomiting, diarrhea, constipation, anal bleeding and rectal pain.  Genitourinary: Negative for dysuria, frequency and difficulty urinating.  Musculoskeletal: Negative for myalgias and gait problem.  Skin: Positive for wound. Negative for color change, pallor and rash.  Scratches at many arm scabs    Neurological: Negative for dizziness, speech difficulty, weakness and numbness.  Hematological: Negative for adenopathy.  Psychiatric/Behavioral: Negative for confusion and agitation. The patient is not nervous/anxious.  Objective:   Physical Exam  Constitutional: She is oriented to person, place, and time. She appears well-developed and well-nourished. No distress.  HENT:  Head: Normocephalic.  Mouth/Throat: Oropharynx is clear and moist. No oropharyngeal exudate.  Eyes: Conjunctivae and EOM are normal. Pupils are equal, round, and reactive to light. No scleral icterus.  Neck: Normal range of motion. Neck supple. No tracheal deviation present.  Cardiovascular: Normal rate, regular rhythm and intact distal pulses.  Pulmonary/Chest: Effort normal and breath sounds normal. No respiratory distress. She exhibits no tenderness.  Abdominal: Soft. She exhibits no distension and no mass. There is tenderness. Hernia confirmed negative in the right inguinal area and confirmed negative in the left inguinal area.    Genitourinary: No vaginal discharge found.  Musculoskeletal: Normal range of motion. She exhibits no tenderness.  Lymphadenopathy:  She has no cervical adenopathy.  Right: No inguinal adenopathy present.  Left: No inguinal adenopathy present.  Neurological: She is alert and oriented to person, place, and time. No cranial nerve deficit. She exhibits normal muscle tone. Coordination normal.  Skin: Skin is warm and dry. No rash noted. She is not diaphoretic. No erythema.  Psychiatric: She has a normal mood and affect. Her behavior is normal. Judgment and thought content normal.  Assessment:   Incarcerated umbilical VWH   Plan:    Repair. Given her prior lower abdominal surgery and morbid obesity, I think she would benefit from laparoscopic exploration. I would plan a larger piece of mesh to decrease recurrence with her diabetes and morbid obesity   The anatomy & physiology of the  abdominal wall was discussed. The pathophysiology of hernias was discussed. Natural history risks without surgery including progeressive enlargement, pain, incarceration & strangulation was discussed. Contributors to complications such as smoking, obesity, diabetes, prior surgery, etc were discussed.   I feel the risks of no intervention will lead to serious problems that outweigh the operative risks; therefore, I recommended surgery to reduce and repair the hernia. I explained laparoscopic techniques with possible need for an open approach. I noted the probable use of mesh to patch and/or buttress the hernia repair  Risks such as bleeding, infection, abscess, need for further treatment, heart attack, death, and other risks were discussed. I noted a good likelihood this will help address the problem. Goals of post-operative recovery were discussed as well. Possibility that this will not correct all symptoms was explained. I stressed the importance of low-impact activity, aggressive pain control, avoiding constipation, & not pushing through pain to minimize risk of post-operative chronic pain or injury. Possibility of reherniation especially with smoking, obesity, diabetes, immunosuppression, and other health conditions was discussed. We will work to minimize complications.  An educational handout further explaining the pathology & treatment options was given as well. Questions were answered. The patient expresses understanding & wishes to proceed with surgery.  I have re-reviewed the the patient's records, history, medications, and allergies.  I have re-examined the patient.  I again discussed intraoperative plans and goals of post-operative recovery.  The patient agrees to proceed.

## 2012-04-20 NOTE — Preoperative (Signed)
Beta Blockers   Reason not to administer Beta Blockers: not prescribed 

## 2012-04-20 NOTE — Progress Notes (Signed)
Patient straight cathed for 300 cc clear yellow urine without difficulty. Instructed if unable to void again by midnight to return to the ER.

## 2012-04-20 NOTE — Transfer of Care (Signed)
Immediate Anesthesia Transfer of Care Note  Patient: Colleen Henry  Procedure(s) Performed: Procedure(s) (LRB) with comments: LAPAROSCOPIC VENTRAL HERNIA (N/A) - laparoscopic ventral wall hernia repair INSERTION OF MESH (N/A) HERNIA REPAIR VENTRAL ADULT (N/A) - Repair incarcerated ventral hernia times two.  Patient Location: PACU  Anesthesia Type:General  Level of Consciousness: awake, alert  and oriented  Airway & Oxygen Therapy: Patient Spontanous Breathing and Patient connected to face mask oxygen  Post-op Assessment: Report given to PACU RN, Post -op Vital signs reviewed and stable and Patient moving all extremities X 4  Post vital signs: Reviewed and stable  Complications: No apparent anesthesia complications

## 2012-04-23 ENCOUNTER — Encounter (HOSPITAL_COMMUNITY): Payer: Self-pay | Admitting: Surgery

## 2012-05-08 ENCOUNTER — Encounter (INDEPENDENT_AMBULATORY_CARE_PROVIDER_SITE_OTHER): Payer: Self-pay | Admitting: Surgery

## 2012-05-08 ENCOUNTER — Ambulatory Visit (INDEPENDENT_AMBULATORY_CARE_PROVIDER_SITE_OTHER): Payer: Private Health Insurance - Indemnity | Admitting: Surgery

## 2012-05-08 VITALS — BP 132/76 | HR 61 | Temp 98.2°F | Resp 18 | Ht 60.5 in | Wt 221.8 lb

## 2012-05-08 DIAGNOSIS — K439 Ventral hernia without obstruction or gangrene: Secondary | ICD-10-CM

## 2012-05-08 NOTE — Progress Notes (Signed)
Subjective:     Patient ID: Colleen Henry, female   DOB: [birth year], 51 y.o.   MRN: [id number]  HPI  Colleen Henry  Jan 15, 1961 [id number]  Patient Care Team: Milus Height, PA as PCP - General (Nurse Practitioner) Turner Daniels, MD as Consulting Physician (Obstetrics and Gynecology)  This patient is a 51 y.o.female who presents today for surgical evaluation Status post laparoscopic repair of ventral wall periumbilical hernias x2 with mesh.  The patient comes in today feeling well.  Weaned off narcotics quickly.  Ibuprofen works best for her.  Eating well.  No fevers or chills.  Energy level is good.  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  Has intentionally lost 30 pounds and is hoping to lose more.  In good spirits..     Patient Active Problem List  Diagnosis  . Umbilical hernia  . Diabetes mellitus, type 2  . HTN (hypertension)  . Depression  . Obesity, Class III, BMI 40-49.9 (morbid obesity)    Past Medical History  Diagnosis Date  . Hypertension   . Complication of anesthesia     agitation upon waking up as a child .   . Diabetes mellitus     oral meds for diabetes . Type 2    Past Surgical History  Procedure Date  . Tonsillectomy and adenoidectomy age 58  . Cesarean section 01/16/1998  . I&d extremity   . Ventral hernia repair 04/20/2012    Procedure: LAPAROSCOPIC VENTRAL HERNIA;  Surgeon: Ardeth Sportsman, MD;  Location: [facility] OR;  Service: General;  Laterality: N/A;  laparoscopic ventral wall hernia repair  . Insertion of mesh 04/20/2012    Procedure: INSERTION OF MESH;  Surgeon: Ardeth Sportsman, MD;  Location[facility];  Service: General;  Laterality: N/A;  . Ventral hernia repair 04/20/2012    Procedure: HERNIA REPAIR VENTRAL ADULT;  Surgeon: Ardeth Sportsman, MD;  Location: [facility] OR;  Service: General;  Laterality: N/A;  Repair incarcerated ventral hernia times two.    History   Social History  . Marital Status: Married    Spouse Name: N/A    Number of Children:  N/A  . Years of Education: N/A   Occupational History  . Not on file.   Social History Main Topics  . Smoking status: Never Smoker   . Smokeless tobacco: Never Used  . Alcohol Use: Yes     Comment: occasionally - socially  . Drug Use: No  . Sexually Active: Yes   Other Topics Concern  . Not on file   Social History Narrative  . No narrative on file    No family history on file.  Current Outpatient Prescriptions  Medication Sig Dispense Refill  . glimepiride (AMARYL) 2 MG tablet Take 2 mg by mouth daily before breakfast.      . lisinopril-hydrochlorothiazide (PRINZIDE,ZESTORETIC) 10-12.5 MG per tablet Take 1 tablet by mouth daily.      . metFORMIN (GLUCOPHAGE) 500 MG tablet Take 500 mg by mouth daily with breakfast.       . sertraline (ZOLOFT) 50 MG tablet Take 50 mg by mouth daily.      . medroxyPROGESTERone (PROVERA) 5 MG tablet       . oxyCODONE (OXY IR/ROXICODONE) 5 MG immediate release tablet Take 1-2 tablets (5-10 mg total) by mouth every 4 (four) hours as needed for pain.  50 tablet  0     Allergies  Allergen Reactions  . Penicillins Hives    Tolerates  keflex    BP 132/76  Pulse 61  Temp 98.2 F (36.8 C) (Temporal)  Resp 18  Ht 5' 0.5" (1.537 m)  Wt 221 lb 12.8 oz (100.608 kg)  BMI 42.60 kg/m2  LMP 03/23/2012  Dg Chest 2 View  04/16/2012  *RADIOLOGY REPORT*  Clinical Data: 51 year old female preoperative study for ventral hernia repair.  Hypertension and diabetes.  CHEST - 2 VIEW  Comparison: None.  Findings: Cardiac size at the upper limits of normal to mildly enlarged. Other mediastinal contours are within normal limits. Visualized tracheal air column is within normal limits.  Mild elevation of the right hemidiaphragm.  No pneumothorax, pulmonary edema, pleural effusion or confluent pulmonary opacity. No acute osseous abnormality identified.  IMPRESSION: No acute cardiopulmonary abnormality.   Original Report Authenticated By: Erskine Speed, M.D.       Review of Systems  Constitutional: Negative for fever, chills and diaphoresis.  HENT: Negative for ear pain, sore throat and trouble swallowing.   Eyes: Negative for photophobia and visual disturbance.  Respiratory: Negative for cough and choking.   Cardiovascular: Negative for chest pain and palpitations.  Gastrointestinal: Negative for nausea, vomiting, abdominal pain, diarrhea, constipation, anal bleeding and rectal pain.  Genitourinary: Negative for dysuria, frequency and difficulty urinating.  Musculoskeletal: Negative for myalgias and gait problem.  Skin: Negative for color change, pallor and rash.  Neurological: Negative for dizziness, speech difficulty, weakness and numbness.  Hematological: Negative for adenopathy.  Psychiatric/Behavioral: Negative for confusion and agitation. The patient is not nervous/anxious.        Objective:   Physical Exam  Constitutional: She is oriented to person, place, and time. She appears well-developed and well-nourished. No distress.  HENT:  Head: Normocephalic.  Mouth/Throat: Oropharynx is clear and moist. No oropharyngeal exudate.  Eyes: Conjunctivae normal and EOM are normal. Pupils are equal, round, and reactive to light. No scleral icterus.  Neck: Normal range of motion. No tracheal deviation present.  Cardiovascular: Normal rate and intact distal pulses.   Pulmonary/Chest: Effort normal. No respiratory distress. She exhibits no tenderness.  Abdominal: Soft. She exhibits no distension. There is no tenderness. Hernia confirmed negative in the right inguinal area and confirmed negative in the left inguinal area.       Incisions clean with normal healing ridges.  No hernias  Genitourinary: No vaginal discharge found.  Musculoskeletal: Normal range of motion. She exhibits no tenderness.  Lymphadenopathy:       Right: No inguinal adenopathy present.       Left: No inguinal adenopathy present.  Neurological: She is alert and oriented to  person, place, and time. No cranial nerve deficit. She exhibits normal muscle tone. Coordination normal.  Skin: Skin is warm and dry. No rash noted. She is not diaphoretic.  Psychiatric: She has a normal mood and affect. Her behavior is normal.       Assessment:     2.5 weeks s/p lap VWH repair w mesh, recovering remarkably well    Plan:     Increase activity as tolerated to regular activity.  Do not push through pain.  Diet as tolerated. Bowel regimen to avoid problems.  Return to clinic p.r.n.   Instructions discussed.  Followup with primary care physician for other health issues as would normally be done.  Questions answered.  The patient expressed understanding and appreciation

## 2012-05-08 NOTE — Patient Instructions (Addendum)
HERNIA REPAIR: POST OP INSTRUCTIONS  1. DIET: Follow a light bland diet the first 24 hours after arrival home, such as soup, liquids, crackers, etc.  Be sure to include lots of fluids daily.  Avoid fast food or heavy meals as your are more likely to get nauseated.  Eat a low fat the next few days after surgery. 2. Take your usually prescribed home medications unless otherwise directed. 3. PAIN CONTROL: a. Pain is best controlled by a usual combination of three different methods TOGETHER: i. Ice/Heat ii. Over the counter pain medication iii. Prescription pain medication b. Most patients will experience some swelling and bruising around the hernia(s) such as the bellybutton, groins, or old incisions.  Ice packs or heating pads (30-60 minutes up to 6 times a day) will help. Use ice for the first few days to help decrease swelling and bruising, then switch to heat to help relax tight/sore spots and speed recovery.  Some people prefer to use ice alone, heat alone, alternating between ice & heat.  Experiment to what works for you.  Swelling and bruising can take several weeks to resolve.   c. It is helpful to take an over-the-counter pain medication regularly for the first few weeks.  Choose one of the following that works best for you: i. Naproxen (Aleve, etc)  Two 220mg tabs twice a day ii. Ibuprofen (Advil, etc) Three 200mg tabs four times a day (every meal & bedtime) iii. Acetaminophen (Tylenol, etc) 325-650mg four times a day (every meal & bedtime) d. A  prescription for pain medication should be given to you upon discharge.  Take your pain medication as prescribed.  i. If you are having problems/concerns with the prescription medicine (does not control pain, nausea, vomiting, rash, itching, etc), please call us [phone] to see if we need to switch you to a different pain medicine that will work better for you and/or control your side effect better. ii. If you need a refill on your pain  medication, please contact your pharmacy.  They will contact our office to request authorization. Prescriptions will not be filled after 5 pm or on week-ends. 4. Avoid getting constipated.  Between the surgery and the pain medications, it is common to experience some constipation.  Increasing fluid intake and taking a fiber supplement (such as Metamucil, Citrucel, FiberCon, MiraLax, etc) 1-2 times a day regularly will usually help prevent this problem from occurring.  A mild laxative (prune juice, Milk of Magnesia, MiraLax, etc) should be taken according to package directions if there are no bowel movements after 48 hours.   5. Wash / shower every day.  You may shower over the dressings as they are waterproof.   6. Remove your waterproof bandages 5 days after surgery.  You may leave the incision open to air.  You may replace a dressing/Band-Aid to cover the incision for comfort if you wish.  Continue to shower over incision(s) after the dressing is off.    7. ACTIVITIES as tolerated:   a. You may resume regular (light) daily activities beginning the next day-such as daily self-care, walking, climbing stairs-gradually increasing activities as tolerated.  If you can walk 30 minutes without difficulty, it is safe to try more intense activity such as jogging, treadmill, bicycling, low-impact aerobics, swimming, etc. b. Save the most intensive and strenuous activity for last such as sit-ups, heavy lifting, contact sports, etc  Refrain from any heavy lifting or straining until you are off narcotics for pain control.     c. DO NOT PUSH THROUGH PAIN.  Let pain be your guide: If it hurts to do something, don't do it.  Pain is your body warning you to avoid that activity for another week until the pain goes down. d. You may drive when you are no longer taking prescription pain medication, you can comfortably wear a seatbelt, and you can safely maneuver your car and apply brakes. e. You may have sexual intercourse  when it is comfortable.  8. FOLLOW UP in our office a. Please call [facility] at [phone] to set up an appointment to see your surgeon in the office for a follow-up appointment approximately 2-3 weeks after your surgery. b. Make sure that you call for this appointment the day you arrive home to insure a convenient appointment time. 9.  IF YOU HAVE DISABILITY OR FAMILY LEAVE FORMS, BRING THEM TO THE OFFICE FOR PROCESSING.  DO NOT GIVE THEM TO YOUR DOCTOR.  WHEN TO CALL US [phone]: 1. Poor pain control 2. Reactions / problems with new medications (rash/itching, nausea, etc)  3. Fever over 101.5 F (38.5 C) 4. Inability to urinate 5. Nausea and/or vomiting 6. Worsening swelling or bruising 7. Continued bleeding from incision. 8. Increased pain, redness, or drainage from the incision   The clinic staff is available to answer your questions during regular business hours (8:30am-5pm).  Please don't hesitate to call and ask to speak to one of our nurses for clinical concerns.   If you have a medical emergency, go to the nearest emergency room or call 911.  A surgeon from [facility] is always on call at the hospitals in [place]  [facility] [address], [address], [place], [place]  [address] ?  [address] [address], [place], [place]   [address] MAIN: [phone] ? TOLL FREE: [phone] ? FAX: [phone] [email or web address]  Managing Pain  Pain after surgery or related to activity is often due to strain/injury to muscle, tendon, nerves and/or incisions.  This pain is usually short-term and will improve in a few months.   Many people find it helpful to do the following things TOGETHER to help speed the process of healing and to get back to regular activity more quickly:  1. Avoid heavy physical activity a.  no lifting greater than 20 pounds b. Do not "push through" the pain.  Listen to your body and avoid positions and maneuvers than  reproduce the pain c. Walking is okay as tolerated, but go slowly and stop when getting sore.  d. Remember: If it hurts to do it, then don't do it! 2. Take Anti-inflammatory medication  a. Take with food/snack around the clock for 1-2 weeks i. This helps the muscle and nerve tissues become less irritable and calm down faster b. Choose ONE of the following over-the-counter medications: i. Naproxen 220mg tabs (ex. Aleve) 1-2 pills twice a day  ii. Ibuprofen 200mg tabs (ex. Advil, Motrin) 3-4 pills with every meal and just before bedtime iii. Acetaminophen 500mg tabs (Tylenol) 1-2 pills with every meal and just before bedtime 3. Use a Heating pad or Ice/Cold Pack a. 4-6 times a day b. May use warm bath/hottub  or showers 4. Try Gentle Massage and/or Stretching  a. at the area of pain many times a day b. stop if you feel pain - do not overdo it  Try these steps together to help you body heal faster and avoid making things get worse.  Doing just one of these   things may not be enough.    If you are not getting better after two weeks or are noticing you are getting worse, contact our office for further advice; we may need to re-evaluate you & see what other things we can do to help.   

## 2012-09-03 ENCOUNTER — Other Ambulatory Visit: Payer: Self-pay | Admitting: Obstetrics and Gynecology

## 2012-09-03 DIAGNOSIS — N6310 Unspecified lump in the right breast, unspecified quadrant: Secondary | ICD-10-CM

## 2012-10-09 ENCOUNTER — Ambulatory Visit
Admission: RE | Admit: 2012-10-09 | Discharge: 2012-10-09 | Disposition: A | Discharge status: Home / Self Care | Payer: Private Health Insurance - Indemnity | Source: Ambulatory Visit | Attending: Obstetrics and Gynecology | Admitting: Obstetrics and Gynecology

## 2012-10-09 ENCOUNTER — Other Ambulatory Visit: Payer: Self-pay | Admitting: Obstetrics and Gynecology

## 2012-10-09 DIAGNOSIS — N6310 Unspecified lump in the right breast, unspecified quadrant: Secondary | ICD-10-CM

## 2012-10-17 ENCOUNTER — Other Ambulatory Visit: Payer: Self-pay | Admitting: Obstetrics and Gynecology

## 2012-10-17 ENCOUNTER — Ambulatory Visit
Admission: RE | Admit: 2012-10-17 | Discharge: 2012-10-17 | Disposition: A | Discharge status: Home / Self Care | Payer: Private Health Insurance - Indemnity | Source: Ambulatory Visit | Attending: Obstetrics and Gynecology | Admitting: Obstetrics and Gynecology

## 2012-10-17 DIAGNOSIS — N6310 Unspecified lump in the right breast, unspecified quadrant: Secondary | ICD-10-CM

## 2012-12-14 ENCOUNTER — Other Ambulatory Visit: Payer: Self-pay

## 2012-12-14 DIAGNOSIS — Z1231 Encounter for screening mammogram for malignant neoplasm of breast: Secondary | ICD-10-CM

## 2013-04-09 ENCOUNTER — Ambulatory Visit
Admission: RE | Admit: 2013-04-09 | Discharge: 2013-04-09 | Disposition: A | Discharge status: Home / Self Care | Payer: Private Health Insurance - Indemnity | Source: Ambulatory Visit

## 2013-04-09 ENCOUNTER — Ambulatory Visit: Payer: Private Health Insurance - Indemnity

## 2013-04-09 DIAGNOSIS — Z1231 Encounter for screening mammogram for malignant neoplasm of breast: Secondary | ICD-10-CM

## 2013-12-11 ENCOUNTER — Ambulatory Visit: Payer: Private Health Insurance - Indemnity | Admitting: Sports Medicine

## 2014-04-11 ENCOUNTER — Other Ambulatory Visit: Payer: Self-pay

## 2014-04-11 DIAGNOSIS — Z1231 Encounter for screening mammogram for malignant neoplasm of breast: Secondary | ICD-10-CM

## 2014-05-13 ENCOUNTER — Ambulatory Visit
Admission: RE | Admit: 2014-05-13 | Discharge: 2014-05-13 | Disposition: A | Discharge status: Home / Self Care | Payer: Commercial Indemnity | Source: Ambulatory Visit

## 2014-05-13 DIAGNOSIS — Z1231 Encounter for screening mammogram for malignant neoplasm of breast: Secondary | ICD-10-CM

## 2014-10-20 ENCOUNTER — Ambulatory Visit: Payer: Commercial Indemnity | Admitting: Internal Medicine

## 2014-12-16 ENCOUNTER — Encounter: Payer: Self-pay | Admitting: Internal Medicine

## 2014-12-16 ENCOUNTER — Ambulatory Visit (INDEPENDENT_AMBULATORY_CARE_PROVIDER_SITE_OTHER): Payer: Commercial Indemnity | Admitting: Internal Medicine

## 2014-12-16 VITALS — BP 122/76 | HR 66 | Temp 98.3°F | Resp 12 | Ht 60.25 in | Wt 248.8 lb

## 2014-12-16 DIAGNOSIS — E1165 Type 2 diabetes mellitus with hyperglycemia: Secondary | ICD-10-CM

## 2014-12-16 MED ORDER — METFORMIN HCL ER (OSM) 1000 MG PO TB24
1000.0000 mg | ORAL_TABLET | Freq: Two times a day (BID) | ORAL | Status: DC
Start: 2014-12-16 — End: 2015-07-10

## 2014-12-16 MED ORDER — CANAGLIFLOZIN 100 MG PO TABS: 100.0000 mg | ORAL_TABLET | Freq: Every day | ORAL | Status: DC

## 2014-12-16 NOTE — Progress Notes (Signed)
Patient ID: Colleen Henry, female   DOB: 1960-11-15, 54 y.o.   MRN: [id number]  HPI: Colleen Henry is a 54 y.o.-year-old female, referred by her PCP, REDMON,NOELLE, PA-C, for management of DM2, dx in ~2011, non-insulin-dependent, uncontrolled, without complications.  Last hemoglobin A1c was: 09/17/2014: HbA1c 12.2% 04/11/2014: HbA1c 8.6%  Pt is on a regimen of: - Metformin XR 1000 mg bid - cannot remember what happened with reg metformin - Biocell: Biotin, Zinc, Chromium She stopped Amaryl - was taking 2 mg before b'fast Insulin was suggested to her >> too expensive 300$/mo  Pt checks her sugars 1x a day and they are: - am: 210-240 - 2h after b'fast: n/c - before lunch: n/c - 2h after lunch: n/c - before dinner: n/c - 2h after dinner: n/c - bedtime: n/c - nighttime: n/c No lows. Lowest sugar was 187; ? hypoglycemia awareness  Highest sugar was 240.  Glucometer: UltraTrak  Pt's meals are: - Breakfast:  Peanuts or cream cheese + crackers or hard boiled eggs - Lunch:  Spinach salad, feta cheese, grilled chicken, sometimes [employer] or hamburger - Dinner:  Chicken, broccoli, sometimes homemade pizza - Snacks: 1-  Peanuts or almonds  She lost 22 lbs since 03/2014.  Watching carbs and started walking  3-4 times a week in 08/2014.  - no CKD, last BUN/creatinine:  04/11/2014: 13/0.50, GFR 130; Glu 232 Lab Results  Component Value Date   BUN 14 04/16/2012   CREATININE 0.41* 04/16/2012  04/11/2014: ACR 27.3. On Lisinopril. - last set of lipids: 04/11/2014: 189/87/48/124  Not on a statin. - last eye exam was in 12/2013 (Dr Idolina Primer). No DR.  - no numbness and tingling in her feet.  Pt has FH of DM in M[place], prediabetes in mother.   She has a history of hypertension and, per review of records, hyperlipidemia.  ROS: Constitutional: + weight loss, + fatigue, + Hot flashes, + poor sleep, +  nocturia Eyes: no blurry vision, no xerophthalmia ENT: no sore throat, no nodules palpated  in throat, no dysphagia/odynophagia, no hoarseness Cardiovascular: no CP/SOB/palpitations/leg swelling Respiratory: no cough/SOB Gastrointestinal: no N/V/D/C Musculoskeletal: + muscle aches/no joint aches Skin: no rashes, +  Hair loss Neurological: no tremors/numbness/tingling/dizziness Psychiatric: no depression/+ mild anxiety +  Irregular menses, + Low libido  Past Medical History  Diagnosis Date  . Hypertension   . Complication of anesthesia     agitation upon waking up as a child .   . Diabetes mellitus     oral meds for diabetes . Type 2   Past Surgical History  Procedure Laterality Date  . Tonsillectomy and adenoidectomy  age 56  . Cesarean section  01/16/1998  . I&d extremity    . Ventral hernia repair  04/20/2012    Procedure: LAPAROSCOPIC VENTRAL HERNIA;  Surgeon: Adin Hector, MD;  Location: [facility];  Service: General;  Laterality: N/A;  laparoscopic ventral wall hernia repair  . Insertion of mesh  04/20/2012    Procedure: INSERTION OF MESH;  Surgeon: Adin Hector, MD;  Location: [facility];  Service: General;  Laterality: N/A;  . Ventral hernia repair  04/20/2012    Procedure: HERNIA REPAIR VENTRAL ADULT;  Surgeon: Adin Hector, MD;  Location: [facility];  Service: General;  Laterality: N/A;  Repair incarcerated ventral hernia times two.   History   Social History  . Marital Status: Married    Spouse Name: N/A  . Number of Children: 1   Occupational History  .  office  administrator   Social History Main Topics  . Smoking status: Never Smoker   . Smokeless tobacco: Never Used  . Alcohol Use: Yes     Comment: occasionally - socially -   Wine or vodka once or twice a month , 2-3 drinks  . Drug Use: No   Current Outpatient Prescriptions on File Prior to Visit  Medication Sig Dispense Refill  . glimepiride (AMARYL) 2 MG tablet Take 2 mg by mouth daily before breakfast.    . lisinopril-hydrochlorothiazide (PRINZIDE,ZESTORETIC) 10-12.5 MG per tablet Take 1 tablet by  mouth daily.    . medroxyPROGESTERone (PROVERA) 5 MG tablet     . metFORMIN (GLUCOPHAGE) 500 MG tablet Take 500 mg by mouth daily with breakfast.     . oxyCODONE (OXY IR/ROXICODONE) 5 MG immediate release tablet Take 1-2 tablets (5-10 mg total) by mouth every 4 (four) hours as needed for pain. 50 tablet 0  . sertraline (ZOLOFT) 50 MG tablet Take 50 mg by mouth daily.     No current facility-administered medications on file prior to visit.   Allergies  Allergen Reactions  . Penicillins Hives    Tolerates keflex   The patient has a family history of Family History  Problem Relation Age of Onset  . Hypertension Mother   . Hyperlipidemia Mother   . Diabetes Maternal Grandmother   . Heart disease Maternal Grandfather   . Hyperlipidemia Maternal Grandfather    PE: BP 122/76 mmHg  Pulse 66  Temp(Src) 98.3 F (36.8 C) (Oral)  Resp 12  Ht 5' 0.25" (1.53 m)  Wt 248 lb 12.8 oz (112.855 kg)  BMI 48.21 kg/m2  SpO2 97% Wt Readings from Last 3 Encounters:  12/16/14 248 lb 12.8 oz (112.855 kg)  05/08/12 221 lb 12.8 oz (100.608 kg)  04/16/12 227 lb 15.3 oz (103.4 kg)   Constitutional: obese, in NAD Eyes: PERRLA, EOMI, no exophthalmos ENT: moist mucous membranes, no thyromegaly, no cervical lymphadenopathy Cardiovascular: RRR, No MRG Respiratory: CTA B Gastrointestinal: abdomen soft, NT, ND, BS+ Musculoskeletal: no deformities, strength intact in all 4 Skin: moist, warm, no rashes Neurological: no tremor with outstretched hands, DTR normal in all 4  ASSESSMENT: 1. DM2, non-insulin-dependent, uncontrolled, without complications  PLAN:  1. Patient with long-standing, uncontrolled diabetes, on oral antidiabetic regimen, which is insufficient.  Patient's diabetes became very poorly controlled at the end of last year, after which, she started to make efforts dorsal weight by reducing her carb intake and by starting walking ( she started walking this spring). Her efforts were successful,   as her hemoglobin A1c check today was 9.9%, decreased from 12.2%. This is encouraging that we can most likely manage her diabetes without insulin.  I strongly advised her to start checking her sugars more and also will add Invokana to help both her diabetes and her weight. - We discussed about options for treatment, and I suggested to:  Patient Instructions  Please continue Metformin ER 1000 mg 2x a day with meals. Add Invokana 100 mg in am before breakfast.  Stat checking sugars 2x a day, rotating check times.  Please return in 1.5 month with your sugar log.   - we discussed about SEs of Invokana, which are: dizziness (advised to be careful when stands from sitting position), decreased BP - usually not < normal (BP today is not low,  But we may need to stop the low dose HCTZ if she is orthostatic or blood pressure next visit is low), and fungal UTIs (advised  to let me know if develops one).  - given discount card for Invokana - Strongly advised her to start checking sugars at different times of the day - check 2 times a day, rotating checks - given sugar log and advised how to fill it and to bring it at next appt  - given foot care handout and explained the principles  - given instructions for hypoglycemia management "15-15 rule"  - advised for yearly eye exams >>  She is up-to-date - Hba1c checked today >> 9.9% - Return to clinic in 1.5 mo with sugar log >>  Will need a BMP then

## 2014-12-16 NOTE — Patient Instructions (Addendum)
Please continue Metformin ER 1000 mg 2x a day with meals. Add Invokana 100 mg in am before breakfast.  Stat checking sugars 2x a day, rotating check times.  Please return in 1.5 month with your sugar log.   PATIENT INSTRUCTIONS FOR TYPE 2 DIABETES:  **Please join MyChart!** - see attached instructions about how to join if you have not done so already.  DIET AND EXERCISE Diet and exercise is an important part of diabetic treatment.  We recommended aerobic exercise in the form of brisk walking (working between 40-60% of maximal aerobic capacity, similar to brisk walking) for 150 minutes per week (such as 30 minutes five days per week) along with 3 times per week performing 'resistance' training (using various gauge rubber tubes with handles) 5-10 exercises involving the major muscle groups (upper body, lower body and core) performing 10-15 repetitions (or near fatigue) each exercise. Start at half the above goal but build slowly to reach the above goals. If limited by weight, joint pain, or disability, we recommend daily walking in a swimming pool with water up to waist to reduce pressure from joints while allow for adequate exercise.    BLOOD GLUCOSES Monitoring your blood glucoses is important for continued management of your diabetes. Please check your blood glucoses 2-4 times a day: fasting, before meals and at bedtime (you can rotate these measurements - e.g. one day check before the 3 meals, the next day check before 2 of the meals and before bedtime, etc.).   HYPOGLYCEMIA (low blood sugar) Hypoglycemia is usually a reaction to not eating, exercising, or taking too much insulin/ other diabetes drugs.  Symptoms include tremors, sweating, hunger, confusion, headache, etc. Treat IMMEDIATELY with 15 grams of Carbs: . 4 glucose tablets .  cup regular juice/soda . 2 tablespoons raisins . 4 teaspoons sugar . 1 tablespoon honey Recheck blood glucose in 15 mins and repeat above if still  symptomatic/blood glucose <100.  RECOMMENDATIONS TO REDUCE YOUR RISK OF DIABETIC COMPLICATIONS: * Take your prescribed MEDICATION(S) * Follow a DIABETIC diet: Complex carbs, fiber rich foods, (monounsaturated and polyunsaturated) fats * AVOID saturated/trans fats, high fat foods, >2,300 mg salt per day. * EXERCISE at least 5 times a week for 30 minutes or preferably daily.  * DO NOT SMOKE OR DRINK more than 1 drink a day. * Check your FEET every day. Do not wear tightfitting shoes. Contact us if you develop an ulcer * See your EYE doctor once a year or more if needed * Get a FLU shot once a year * Get a PNEUMONIA vaccine once before and once after age 67 years  GOALS:  * Your Hemoglobin A1c of <7%  * fasting sugars need to be <130 * after meals sugars need to be <180 (2h after you start eating) * Your Systolic BP should be 627 or lower  * Your Diastolic BP should be 80 or lower  * Your HDL (Good Cholesterol) should be 40 or higher  * Your LDL (Bad Cholesterol) should be 100 or lower. * Your Triglycerides should be 150 or lower  * Your Urine microalbumin (kidney function) should be <30 * Your Body Mass Index should be 25 or lower    Please consider the following ways to cut down carbs and fat and increase fiber and micronutrients in your diet: - substitute whole grain for white bread or pasta - substitute brown rice for white rice - substitute 90-calorie flat bread pieces for slices of bread when possible - substitute  sweet potatoes or yams for white potatoes - substitute humus for margarine - substitute tofu for cheese when possible - substitute almond or rice milk for regular milk (would not drink soy milk daily due to concern for soy estrogen influence on breast cancer risk) - substitute dark chocolate for other sweets when possible - substitute water - can add lemon or orange slices for taste - for diet sodas (artificial sweeteners will trick your body that you can eat sweets  without getting calories and will lead you to overeating and weight gain in the long run) - do not skip breakfast or other meals (this will slow down the metabolism and will result in more weight gain over time)  - can try smoothies made from fruit and almond/rice milk in am instead of regular breakfast - can also try old-fashioned (not instant) oatmeal made with almond/rice milk in am - order the dressing on the side when eating salad at a restaurant (pour less than half of the dressing on the salad) - eat as little meat as possible - can try juicing, but should not forget that juicing will get rid of the fiber, so would alternate with eating raw veg./fruits or drinking smoothies - use as little oil as possible, even when using olive oil - can dress a salad with a mix of balsamic vinegar and lemon juice, for e.g. - use agave nectar, stevia sugar, or regular sugar rather than artificial sweateners - steam or broil/roast veggies  - snack on veggies/fruit/nuts (unsalted, preferably) when possible, rather than processed foods - reduce or eliminate aspartame in diet (it is in diet sodas, chewing gum, etc) Read the labels!  Try to read Dr. Janene Harvey book: "Program for Reversing Diabetes" for other ideas for healthy eating.

## 2014-12-17 ENCOUNTER — Encounter: Payer: Self-pay | Admitting: Internal Medicine

## 2015-01-29 ENCOUNTER — Other Ambulatory Visit: Payer: Self-pay | Admitting: *Deleted

## 2015-01-29 ENCOUNTER — Ambulatory Visit (INDEPENDENT_AMBULATORY_CARE_PROVIDER_SITE_OTHER): Payer: Commercial Indemnity | Admitting: Internal Medicine

## 2015-01-29 ENCOUNTER — Other Ambulatory Visit (INDEPENDENT_AMBULATORY_CARE_PROVIDER_SITE_OTHER): Payer: Commercial Indemnity | Admitting: *Deleted

## 2015-01-29 ENCOUNTER — Encounter: Payer: Self-pay | Admitting: Internal Medicine

## 2015-01-29 VITALS — BP 118/78 | HR 85 | Temp 98.2°F | Resp 14 | Wt 247.0 lb

## 2015-01-29 DIAGNOSIS — E1165 Type 2 diabetes mellitus with hyperglycemia: Secondary | ICD-10-CM | POA: Diagnosis not present

## 2015-01-29 LAB — BASIC METABOLIC PANEL
BUN: 10 mg/dL (ref 6–23)
CHLORIDE: 100 meq/L (ref 96–112)
CO2: 32 meq/L (ref 19–32)
Calcium: 9.8 mg/dL (ref 8.4–10.5)
Creatinine, Ser: 0.5 mg/dL (ref 0.40–1.20)
GFR: 137.62 mL/min (ref >60.00)
Glucose, Bld: 132 mg/dL — ABNORMAL HIGH (ref 70–99)
POTASSIUM: 3.6 meq/L (ref 3.5–5.1)
Sodium: 141 mEq/L (ref 135–145)

## 2015-01-29 LAB — POCT GLYCOSYLATED HEMOGLOBIN (HGB A1C): HEMOGLOBIN A1C: 9.9

## 2015-01-29 MED ORDER — METFORMIN HCL ER 500 MG PO TB24: 1000.0000 mg | ORAL_TABLET | Freq: Two times a day (BID) | ORAL | Status: DC

## 2015-01-29 MED ORDER — CANAGLIFLOZIN 100 MG PO TABS: 100.0000 mg | ORAL_TABLET | Freq: Every day | ORAL | Status: DC

## 2015-01-29 NOTE — Patient Instructions (Signed)
Please continue: - Metformin XR 1000 mg 2x a day - Invokana 100 mg daily in am   Please stop at the lab.  Please return in 1.5-2 months with your sugar log.

## 2015-01-29 NOTE — Progress Notes (Signed)
Patient ID: Colleen Henry, female   DOB: 10-11-1960, 54 y.o.   MRN: [id number]  HPI: Colleen Henry is a 54 y.o.-year-old female, returning for f/u for DM2, dx in ~2011, non-insulin-dependent, uncontrolled, without complications.  Last hemoglobin A1c was: 11/2014: HbA1c 9.9% 09/17/2014: HbA1c 12.2% 04/11/2014: HbA1c 8.6%  Pt is on a regimen of: - Metformin XR 1000 mg bid - cannot remember what happened with reg metformin - Invokana 100 mg daily (started 11/2014) - Biocell: Biotin, Zinc, Chromium She stopped Amaryl - was taking 2 mg before b'fast Insulin was suggested to her >> too expensive 300$/mo  Pt checks her sugars 1x a day and they are: - am: 210-240 >> 143-163 - 2h after b'fast: n/c - before lunch: n/c - 2h after lunch: n/c - before dinner: n/c - 2h after dinner: n/c >> 170-180 - bedtime: n/c - nighttime: n/c No lows. Lowest sugar was 143; ? hypoglycemia awareness  Highest sugar was 240 >> 262  Glucometer: UltraTrak  Pt's meals are: - Breakfast:  Peanuts or cream cheese + crackers or hard boiled eggs - Lunch:  Spinach salad, feta cheese, grilled chicken, sometimes Subway or hamburger - Dinner:  Chicken, broccoli, sometimes homemade pizza - Snacks: 1-  Peanuts or almonds  She lost 22 lbs since 03/2014.  Watching carbs and started walking  3-4 times a week in 08/2014.  - no CKD, last BUN/creatinine:  04/11/2014: 13/0.50, GFR 130; Glu 232 Lab Results  Component Value Date   BUN 14 04/16/2012   CREATININE 0.41* 04/16/2012  04/11/2014: ACR 27.3. On Lisinopril. - last set of lipids: 04/11/2014: 189/87/48/124  Not on a statin. - last eye exam was in 12/2013 (Dr Idolina Primer). No DR.  - no numbness and tingling in her feet.  ROS: Constitutional: no weight loss, no fatigue, +  nocturia Eyes: no blurry vision, no xerophthalmia ENT: no sore throat, no nodules palpated in throat, no dysphagia/odynophagia, no hoarseness Cardiovascular: no CP/SOB/palpitations/leg  swelling Respiratory: no cough/SOB Gastrointestinal: no N/V/D/C Musculoskeletal: + muscle aches/no joint aches Skin: no rashes, + itching Neurological: no tremors/numbness/tingling/dizziness + low libido  I reviewed pt's medications, allergies, PMH, social hx, family hx, and changes were documented in the history of present illness. Otherwise, unchanged from my initial visit note.  Past Medical History  Diagnosis Date  . Hypertension   . Complication of anesthesia     agitation upon waking up as a child .   . Diabetes mellitus     oral meds for diabetes . Type 2   Past Surgical History  Procedure Laterality Date  . Tonsillectomy and adenoidectomy  age 77  . Cesarean section  01/16/1998  . I&d extremity    . Ventral hernia repair  04/20/2012    Procedure: LAPAROSCOPIC VENTRAL HERNIA;  Surgeon: Adin Hector, MD;  Location: [facility];  Service: General;  Laterality: N/A;  laparoscopic ventral wall hernia repair  . Insertion of mesh  04/20/2012    Procedure: INSERTION OF MESH;  Surgeon: Adin Hector, MD;  Location: [place];  Service: General;  Laterality: N/A;  . Ventral hernia repair  04/20/2012    Procedure: HERNIA REPAIR VENTRAL ADULT;  Surgeon: Adin Hector, MD;  Location: [place];  Service: General;  Laterality: N/A;  Repair incarcerated ventral hernia times two.   History   Social History  . Marital Status: Married    Spouse Name: N/A  . Number of Children: 1   Occupational History  .  office administrator  Social History Main Topics  . Smoking status: Never Smoker   . Smokeless tobacco: Never Used  . Alcohol Use: Yes     Comment: occasionally - socially -   Wine or vodka once or twice a month , 2-3 drinks  . Drug Use: No   Current Outpatient Prescriptions on File Prior to Visit  Medication Sig Dispense Refill  . canagliflozin (INVOKANA) 100 MG TABS tablet Take 1 tablet (100 mg total) by mouth daily. 30 tablet 2  . lisinopril-hydrochlorothiazide  (PRINZIDE,ZESTORETIC) 10-12.5 MG per tablet Take 1 tablet by mouth daily.    . medroxyPROGESTERone (PROVERA) 5 MG tablet     . metFORMIN (GLUCOPHAGE-XR) 500 MG 24 hr tablet Take 1,000 mg by mouth 2 (two) times daily.    Marland Kitchen oxyCODONE (OXY IR/ROXICODONE) 5 MG immediate release tablet Take 1-2 tablets (5-10 mg total) by mouth every 4 (four) hours as needed for pain. 50 tablet 0  . sertraline (ZOLOFT) 50 MG tablet Take 50 mg by mouth daily.     No current facility-administered medications on file prior to visit.   Allergies  Allergen Reactions  . Penicillins Hives    Tolerates keflex   The patient has a family history of Family History  Problem Relation Age of Onset  . Hypertension Mother   . Hyperlipidemia Mother   . Diabetes Maternal Grandmother   . Heart disease Maternal Grandfather   . Hyperlipidemia Maternal Grandfather    PE: BP 118/78 mmHg  Pulse 85  Temp(Src) 98.2 F (36.8 C) (Oral)  Resp 14  Wt 247 lb (112.038 kg)  SpO2 96% Wt Readings from Last 3 Encounters:  01/29/15 247 lb (112.038 kg)  12/16/14 248 lb 12.8 oz (112.855 kg)  05/08/12 221 lb 12.8 oz (100.608 kg)   Constitutional: obese, in NAD Eyes: PERRLA, EOMI, no exophthalmos ENT: moist mucous membranes, no thyromegaly, no cervical lymphadenopathy Cardiovascular: RRR, No MRG Respiratory: CTA B Gastrointestinal: abdomen soft, NT, ND, BS+ Musculoskeletal: no deformities, strength intact in all 4 Skin: moist, warm, no rashes Neurological: no tremor with outstretched hands, DTR normal in all 4  ASSESSMENT: 1. DM2, non-insulin-dependent, uncontrolled, without complications  PLAN:  1. Patient with long-standing, uncontrolled diabetes, on oral antidiabetic regimen, with metformin and now Invokana.Great improvement on Invokana, but sugars still higher than goal. We discussed about adding another med, but she would like to work more on her diet and lose weight.  - We discussed about options for treatment, and I  suggested to:  Patient Instructions  Please continue: - Metformin XR 1000 mg 2x a day - Invokana 100 mg daily in am   Please stop at the lab.  Please return in 1.5-2 months with your sugar log.   - no SEs from Invokana >> will check a BMP today - continue checking sugars at different times of the day - check 2 times a day, rotating checks - advised for yearly eye exams >>  She is due - Hba1c checked today >> 9.9% - Return to clinic in 1.5-2 mo with sugar log   Orders Only on 01/29/2015  Component Date Value Ref Range Status  . Hemoglobin A1C 12/16/2014 9.9   Final  Office Visit on 01/29/2015  Component Date Value Ref Range Status  . Sodium 01/29/2015 141  135 - 145 mEq/L Final  . Potassium 01/29/2015 3.6  3.5 - 5.1 mEq/L Final  . Chloride 01/29/2015 100  96 - 112 mEq/L Final  . CO2 01/29/2015 32  19 - 32 mEq/L  Final  . Glucose, Bld 01/29/2015 132* 70 - 99 mg/dL Final  . BUN 01/29/2015 10  6 - 23 mg/dL Final  . Creatinine, Ser 01/29/2015 0.50  0.40 - 1.20 mg/dL Final  . Calcium 01/29/2015 9.8  8.4 - 10.5 mg/dL Final  . GFR 01/29/2015 137.62  >60.00 mL/min Final   GFR and potassium normal, continue Invokana.

## 2015-02-03 ENCOUNTER — Encounter: Payer: Self-pay | Admitting: Internal Medicine

## 2015-02-03 NOTE — Progress Notes (Signed)
Received labs from PCP from 01/29/2015: ACR 31.6 (0-30)

## 2015-02-06 ENCOUNTER — Telehealth: Payer: Self-pay | Admitting: Internal Medicine

## 2015-02-06 NOTE — Telephone Encounter (Signed)
Called pt and advised her per Dr Arman Filter result note.

## 2015-02-06 NOTE — Telephone Encounter (Signed)
Patient is calling for the results of her labs work.

## 2015-04-02 ENCOUNTER — Ambulatory Visit: Payer: Commercial Indemnity | Admitting: Internal Medicine

## 2015-04-07 ENCOUNTER — Ambulatory Visit (INDEPENDENT_AMBULATORY_CARE_PROVIDER_SITE_OTHER): Payer: Commercial Indemnity | Admitting: Internal Medicine

## 2015-04-07 ENCOUNTER — Encounter: Payer: Self-pay | Admitting: Internal Medicine

## 2015-04-07 ENCOUNTER — Other Ambulatory Visit (INDEPENDENT_AMBULATORY_CARE_PROVIDER_SITE_OTHER): Payer: Commercial Indemnity | Admitting: *Deleted

## 2015-04-07 VITALS — BP 114/60 | HR 68 | Temp 98.6°F | Resp 12 | Wt 243.0 lb

## 2015-04-07 DIAGNOSIS — Z23 Encounter for immunization: Secondary | ICD-10-CM | POA: Diagnosis not present

## 2015-04-07 DIAGNOSIS — E1165 Type 2 diabetes mellitus with hyperglycemia: Secondary | ICD-10-CM

## 2015-04-07 LAB — HM DIABETES EYE EXAM

## 2015-04-07 LAB — POCT GLYCOSYLATED HEMOGLOBIN (HGB A1C): HEMOGLOBIN A1C: 8.5

## 2015-04-07 NOTE — Progress Notes (Signed)
Patient ID: Colleen Henry, female   DOB: 1960-11-19, 54 y.o.   MRN: [id number]  HPI: Colleen Henry is a 54 y.o.-year-old female, returning for f/u for DM2, dx in ~2011, non-insulin-dependent, uncontrolled, without complications. Last visit 2 mo ago.  Last hemoglobin A1c was: 11/2014: HbA1c 9.9% 09/17/2014: HbA1c 12.2% 04/11/2014: HbA1c 8.6%  Pt is on a regimen of: - Metformin XR 1000 mg bid - cannot remember what happened with reg metformin - Invokana 100 mg daily (started 11/2014) - Biocell: Biotin, Zinc, Chromium She stopped Amaryl - was taking 2 mg before b'fast Insulin was s - cannot remember what happened with reg metforminuggested to her >> too expensive 300$/mo  Pt checks her sugars 1x a day and they are (no log, no meter): - am: 210-240 >> 143-163 >> 130-140, 150 (after coffee or creamer) - 2h after b'fast: n/c - before lunch: n/c - 2h after lunch: n/c >> 150s - before dinner: n/c - 2h after dinner: n/c >> 170-180 >> 150-180 - bedtime: n/c - nighttime: n/c No lows. Lowest sugar was 143; ? hypoglycemia awareness  Highest sugar was 240 >> 262 >> 190  Glucometer: UltraTrak  Pt's meals are: - Breakfast:  Peanuts or cream cheese + crackers or hard boiled eggs - Lunch:  Spinach salad, feta cheese, grilled chicken, sometimes Subway or hamburger - Dinner:  Chicken, broccoli, sometimes homemade pizza - Snacks: 1-  Peanuts or almonds  She lost 22 lbs since 03/2014, but only 3-4 in last 3-4 mo.  - no CKD, last BUN/creatinine:   Lab Results  Component Value Date   BUN 10 01/29/2015   CREATININE 0.50 01/29/2015  04/11/2014: 13/0.50, GFR 130; Glu 232 04/11/2014: ACR 27.3, 01/29/2015: ACR 31.6. On Lisinopril. - last set of lipids: 09/17/2014: 183/109/47/114 04/11/2014: 189/87/48/124  Not on a statin. - last eye exam was in 03/2015 (Dr Idolina Primer). No DR.  - no numbness and tingling in her feet.  ROS: Constitutional: no weight loss, no fatigue, + nocturia Eyes: no blurry  vision, no xerophthalmia ENT: no sore throat, no nodules palpated in throat, no dysphagia/odynophagia, no hoarseness Cardiovascular: no CP/SOB/palpitations/leg swelling Respiratory: no cough/SOB Gastrointestinal: no N/V/D/C Musculoskeletal: no muscle aches/no joint aches Skin: no rashes Neurological: no tremors/numbness/tingling/dizziness  I reviewed pt's medications, allergies, PMH, social hx, family hx, and changes were documented in the history of present illness. Otherwise, unchanged from my initial visit note.  Past Medical History  Diagnosis Date  . Hypertension   . Complication of anesthesia     agitation upon waking up as a child .   . Diabetes mellitus     oral meds for diabetes . Type 2   Past Surgical History  Procedure Laterality Date  . Tonsillectomy and adenoidectomy  age 47  . Cesarean section  01/16/1998  . I&d extremity    . Ventral hernia repair  04/20/2012    Procedure: LAPAROSCOPIC VENTRAL HERNIA;  Surgeon: Adin Hector, MD;  Location: [place] [facility];  Service: General;  Laterality: N/A;  laparoscopic ventral wall hernia repair  . Insertion of mesh  04/20/2012    Procedure: INSERTION OF MESH;  Surgeon: Adin Hector, MD;  Location: [place];  Service: General;  Laterality: N/A;  . Ventral hernia repair  04/20/2012    Procedure: HERNIA REPAIR VENTRAL ADULT;  Surgeon: Adin Hector, MD;  Location: [facility];  Service: General;  Laterality: N/A;  Repair incarcerated ventral hernia times two.   History   Social History  . Marital Status:  Married    Spouse Name: N/A  . Number of Children: 1   Occupational History  .  office administrator   Social History Main Topics  . Smoking status: Never Smoker   . Smokeless tobacco: Never Used  . Alcohol Use: Yes     Comment: occasionally - socially -   Wine or vodka once or twice a month , 2-3 drinks  . Drug Use: No   Current Outpatient Prescriptions on File Prior to Visit  Medication Sig Dispense Refill  . canagliflozin  (INVOKANA) 100 MG TABS tablet Take 1 tablet (100 mg total) by mouth daily. 30 tablet 2  . lisinopril-hydrochlorothiazide (PRINZIDE,ZESTORETIC) 10-12.5 MG per tablet Take 1 tablet by mouth daily.    . medroxyPROGESTERone (PROVERA) 5 MG tablet     . metFORMIN (GLUCOPHAGE-XR) 500 MG 24 hr tablet Take 2 tablets (1,000 mg total) by mouth 2 (two) times daily. 360 tablet 1  . oxyCODONE (OXY IR/ROXICODONE) 5 MG immediate release tablet Take 1-2 tablets (5-10 mg total) by mouth every 4 (four) hours as needed for pain. 50 tablet 0  . sertraline (ZOLOFT) 50 MG tablet Take 50 mg by mouth daily.     No current facility-administered medications on file prior to visit.   Allergies  Allergen Reactions  . Penicillins Hives    Tolerates keflex   The patient has a family history of Family History  Problem Relation Age of Onset  . Hypertension Mother   . Hyperlipidemia Mother   . Diabetes Maternal Grandmother   . Heart disease Maternal Grandfather   . Hyperlipidemia Maternal Grandfather    PE: BP 114/60 mmHg  Pulse 68  Temp(Src) 98.6 F (37 C) (Oral)  Resp 12  Wt 243 lb (110.224 kg)  SpO2 95% Body mass index is 47.09 kg/(m^2). Wt Readings from Last 3 Encounters:  04/07/15 243 lb (110.224 kg)  01/29/15 247 lb (112.038 kg)  12/16/14 248 lb 12.8 oz (112.855 kg)   Constitutional: obese, in NAD Eyes: PERRLA, EOMI, no exophthalmos ENT: moist mucous membranes, no thyromegaly, no cervical lymphadenopathy Cardiovascular: RRR, No MRG Respiratory: CTA B Gastrointestinal: abdomen soft, NT, ND, BS+ Musculoskeletal: no deformities, strength intact in all 4 Skin: moist, warm, no rashes Neurological: no tremor with outstretched hands, DTR normal in all 4  ASSESSMENT: 1. DM2, non-insulin-dependent, uncontrolled, without complications  PLAN:  1. Patient with long-standing, uncontrolled diabetes, on oral antidiabetic regimen, with metformin and now Invokana.Great improvement on Invokana, but sugars still  higher than goal. Last HbA1c was 9.9%. At last visit, we discussed about adding another med, but she wanted to work more on her diet and lose weight. She lost a little weight from last visit, but she still wants to hold off adding another med and work on diet and exercise. Hba1c checked today >> 8.5% (better). - I suggested to continue the same regimen or now:  Patient Instructions  Please continue: - Metformin XR 1000 mg 2x a day - Invokana 100 mg daily in am   Please return in 3 months with your sugar log.   - no SEs from Invokana  - continue checking sugars at different times of the day - check 2 times a day, rotating checks - advised for yearly eye exams >>  She is UTD - will give her the flu shot - Return to clinic in 3 mo with sugar log

## 2015-04-07 NOTE — Patient Instructions (Signed)
Please continue: - Metformin XR 1000 mg 2x a day - Invokana 100 mg daily in am   Please return in 3 months with your sugar log.

## 2015-04-17 ENCOUNTER — Other Ambulatory Visit: Payer: Self-pay

## 2015-04-17 DIAGNOSIS — Z1231 Encounter for screening mammogram for malignant neoplasm of breast: Secondary | ICD-10-CM

## 2015-05-22 ENCOUNTER — Ambulatory Visit
Admission: RE | Admit: 2015-05-22 | Discharge: 2015-05-22 | Disposition: A | Discharge status: Home / Self Care | Payer: 59 | Source: Ambulatory Visit

## 2015-05-22 DIAGNOSIS — Z1231 Encounter for screening mammogram for malignant neoplasm of breast: Secondary | ICD-10-CM

## 2015-07-08 ENCOUNTER — Ambulatory Visit: Payer: Commercial Indemnity | Admitting: Internal Medicine

## 2015-07-10 ENCOUNTER — Other Ambulatory Visit: Payer: Self-pay | Admitting: Internal Medicine

## 2015-08-26 ENCOUNTER — Other Ambulatory Visit (INDEPENDENT_AMBULATORY_CARE_PROVIDER_SITE_OTHER): Payer: 59 | Admitting: *Deleted

## 2015-08-26 ENCOUNTER — Ambulatory Visit (INDEPENDENT_AMBULATORY_CARE_PROVIDER_SITE_OTHER): Payer: 59 | Admitting: Internal Medicine

## 2015-08-26 ENCOUNTER — Encounter: Payer: Self-pay | Admitting: Internal Medicine

## 2015-08-26 VITALS — BP 128/88 | HR 78 | Temp 98.0°F | Resp 12 | Wt 243.0 lb

## 2015-08-26 DIAGNOSIS — E1165 Type 2 diabetes mellitus with hyperglycemia: Secondary | ICD-10-CM

## 2015-08-26 LAB — POCT GLYCOSYLATED HEMOGLOBIN (HGB A1C): Hemoglobin A1C: 9.8

## 2015-08-26 MED ORDER — DAPAGLIFLOZIN PROPANEDIOL 10 MG PO TABS: 10.0000 mg | ORAL_TABLET | Freq: Every day | ORAL | Status: DC

## 2015-08-26 NOTE — Progress Notes (Addendum)
Patient ID: Colleen Henry, female   DOB: 10/23/1960, 55 y.o.   MRN: [id number]  HPI: Colleen Henry is a 55 y.o.-year-old female, returning for f/u for DM2, dx in ~2011, non-insulin-dependent, uncontrolled, without complications. Last visit 5 mo ago.  Last hemoglobin A1c was: Lab Results  Component Value Date   HGBA1C 8.5 04/07/2015   HGBA1C 9.9 12/16/2014  11/2014: HbA1c 9.9% 09/17/2014: HbA1c 12.2% 04/11/2014: HbA1c 8.6%  Pt is on a regimen of: - Metformin XR 1000 mg bid - cannot remember what happened with reg metformin - Biocell: Biotin, Zinc, Chromium Off Invokana 100 mg daily (started 11/2014 >> stopped 05/2015!! - concern for kidney ds. She stopped Amaryl - was taking 2 mg before b'fast Insulin was suggested to her >> too expensive 300$/mo  Pt checks her sugars 1x a day and they are (has meter): - am: 210-240 >> 143-163 >> 130-140, 150 (after coffee or creamer) >> 150-230 - 2h after b'fast: n/c - before lunch: n/c - 2h after lunch: n/c >> 150s >> n/c - before dinner: n/c - 2h after dinner: n/c >> 170-180 >> 150-180 >> n/c - bedtime: n/c - nighttime: n/c No lows. Lowest sugar was 143 >> 150; ? hypoglycemia awareness  Highest sugar was 240 >> 262 >> 190 >> 200s  Glucometer: UltraTrak  Pt's meals are: - Breakfast:  Peanuts or cream cheese + crackers or hard boiled eggs - Lunch:  Spinach salad, feta cheese, grilled chicken, sometimes Subway or hamburger - Dinner:  Chicken, broccoli, sometimes homemade pizza - Snacks: 1-  Peanuts or almonds  - no CKD, last BUN/creatinine:  07/31/2015: 14/0.41, eGFR 162 01/29/2015: ACR 31.6. Lab Results  Component Value Date   BUN 10 01/29/2015   CREATININE 0.50 01/29/2015  04/11/2014: 13/0.50, eGFR 130; Glu 232 04/11/2014: ACR 27.3  On Lisinopril. - last set of lipids: 07/31/2015: 190/75/54/120 09/17/2014: 183/109/47/114 04/11/2014: 189/87/48/124  Not on a statin. - last eye exam was in 03/2015 (Dr Idolina Primer). No DR.  - no  numbness and tingling in her feet.  ROS: Constitutional: no weight loss, no fatigue Eyes: no blurry vision, no xerophthalmia ENT: no sore throat, no nodules palpated in throat, no dysphagia/odynophagia, no hoarseness Cardiovascular: no CP/SOB/palpitations/leg swelling Respiratory: no cough/SOB Gastrointestinal: no N/V/D/C Musculoskeletal: no muscle aches/no joint aches Skin: no rashes Neurological: no tremors/numbness/tingling/dizziness + irreg menses  I reviewed pt's medications, allergies, PMH, social hx, family hx, and changes were documented in the history of present illness. Otherwise, unchanged from my initial visit note.  Past Medical History  Diagnosis Date  . Hypertension   . Complication of anesthesia     agitation upon waking up as a child .   . Diabetes mellitus     oral meds for diabetes . Type 2   Past Surgical History  Procedure Laterality Date  . Tonsillectomy and adenoidectomy  age 68  . Cesarean section  01/16/1998  . I&d extremity    . Ventral hernia repair  04/20/2012    Procedure: LAPAROSCOPIC VENTRAL HERNIA;  Surgeon: Adin Hector, MD;  Location: [place];  Service: General;  Laterality: N/A;  laparoscopic ventral wall hernia repair  . Insertion of mesh  04/20/2012    Procedure: INSERTION OF MESH;  Surgeon: Adin Hector, MD;  Location: [place];  Service: General;  Laterality: N/A;  . Ventral hernia repair  04/20/2012    Procedure: HERNIA REPAIR VENTRAL ADULT;  Surgeon: Adin Hector, MD;  Location: [place];  Service: General;  Laterality: N/A;  Repair incarcerated ventral hernia times two.   History   Social History  . Marital Status: Married    Spouse Name: N/A  . Number of Children: 1   Occupational History  .  office administrator   Social History Main Topics  . Smoking status: Never Smoker   . Smokeless tobacco: Never Used  . Alcohol Use: Yes     Comment: occasionally - socially -   Wine or vodka once or twice a month , 2-3 drinks  . Drug  Use: No   Current Outpatient Prescriptions on File Prior to Visit  Medication Sig Dispense Refill  . medroxyPROGESTERone (PROVERA) 5 MG tablet Take 5 mg by mouth as needed.     . metFORMIN (GLUCOPHAGE-XR) 500 MG 24 hr tablet Take 2 tablets (1,000 mg total) by mouth 2 (two) times daily. 360 tablet 1  . metFORMIN (GLUCOPHAGE-XR) 500 MG 24 hr tablet TAKE 2 TABLETS BY MOUTH TWICE DAILY WITHA MEAL 360 tablet 0  . sertraline (ZOLOFT) 50 MG tablet Take 50 mg by mouth daily.     No current facility-administered medications on file prior to visit.   Allergies  Allergen Reactions  . Penicillins Hives    Tolerates keflex   The patient has a family history of Family History  Problem Relation Age of Onset  . Hypertension Mother   . Hyperlipidemia Mother   . Diabetes Maternal Grandmother   . Heart disease Maternal Grandfather   . Hyperlipidemia Maternal Grandfather    PE: BP 128/88 mmHg  Pulse 78  Temp(Src) 98 F (36.7 C) (Oral)  Resp 12  Wt 243 lb (110.224 kg)  SpO2 95% Body mass index is 47.09 kg/(m^2). Wt Readings from Last 3 Encounters:  08/26/15 243 lb (110.224 kg)  04/07/15 243 lb (110.224 kg)  01/29/15 247 lb (112.038 kg)   Constitutional: obese, in NAD Eyes: PERRLA, EOMI, no exophthalmos ENT: moist mucous membranes, no thyromegaly, no cervical lymphadenopathy Cardiovascular: RRR, No MRG Respiratory: CTA B Gastrointestinal: abdomen soft, NT, ND, BS+ Musculoskeletal: no deformities, strength intact in all 4 Skin: moist, warm, no rashes Neurological: no tremor with outstretched hands, DTR normal in all 4  ASSESSMENT: 1. DM2, non-insulin-dependent, uncontrolled, without complications  PLAN:  1. Patient with long-standing, uncontrolled diabetes, on oral antidiabetic regimen, with metformin and now off Invokana in the last 2 months >> sugars MUCH higher. Discussed that the risk of kidney ds with the SGLT2 inh is low and she recently had a normal GFR. Will restart SGLT2 inh  (Invokana not covered >> suggested Farxiga, given discount card) >> will check GFR at next visit. We may need to add a DPP4 ing at next visit (use Glyxambi?) - I suggested to:  Patient Instructions  Please continue: - Metformin ER 1000 mg 2x a day  Please add: - Farxiga 10 mg daily in am, before breakfast  Please return in 1.5 months with your sugar log.   - advised to check sugars later in the day, also - advised for yearly eye exams >>  She is UTD - given her the flu shot this season - checked HbA1c today >> 9.8% (higher) - Return to clinic in 1.5 mo with sugar log

## 2015-08-26 NOTE — Patient Instructions (Signed)
Please continue: - Metformin ER 1000 mg 2x a day  Please add: - Farxiga 10 mg daily in am, before breakfast  Please return in 1.5 months with your sugar log.

## 2015-10-15 ENCOUNTER — Ambulatory Visit: Payer: 59 | Admitting: Internal Medicine

## 2015-10-20 ENCOUNTER — Other Ambulatory Visit: Payer: Self-pay | Admitting: Internal Medicine

## 2015-10-20 ENCOUNTER — Telehealth: Payer: Self-pay | Admitting: Internal Medicine

## 2015-10-20 NOTE — Telephone Encounter (Signed)
Refill sent.

## 2015-10-20 NOTE — Telephone Encounter (Signed)
Patient need a refill of her metformin 90 day supply send to  [place], [place]. [phone] (Phone) [phone] (Fax)

## 2015-11-09 ENCOUNTER — Other Ambulatory Visit: Payer: Self-pay | Admitting: Internal Medicine

## 2015-11-24 ENCOUNTER — Ambulatory Visit: Payer: 59 | Admitting: Internal Medicine

## 2015-11-27 ENCOUNTER — Encounter: Payer: Self-pay | Admitting: Internal Medicine

## 2015-11-27 ENCOUNTER — Ambulatory Visit (INDEPENDENT_AMBULATORY_CARE_PROVIDER_SITE_OTHER): Payer: 59 | Admitting: Internal Medicine

## 2015-11-27 VITALS — BP 130/78 | HR 75 | Ht 60.2 in | Wt 246.0 lb

## 2015-11-27 DIAGNOSIS — E1165 Type 2 diabetes mellitus with hyperglycemia: Secondary | ICD-10-CM

## 2015-11-27 LAB — POCT GLYCOSYLATED HEMOGLOBIN (HGB A1C): Hemoglobin A1C: 9.8

## 2015-11-27 MED ORDER — GLIPIZIDE 5 MG PO TABS
5.0000 mg | ORAL_TABLET | Freq: Every day | ORAL | Status: DC
Start: 2015-11-27 — End: 2016-12-23

## 2015-11-27 MED ORDER — CANAGLIFLOZIN 100 MG PO TABS: 100.0000 mg | ORAL_TABLET | Freq: Every day | ORAL | Status: DC

## 2015-11-27 NOTE — Patient Instructions (Addendum)
Please move all Metformin XR at night: 2000 mg with dinner.  Restart Invokana 100 mg daily in am before b'fast.  Start Glipizide 5 mg before dinner - ~ 2 weeks after starting Invokana if sugars are still high.   Please return in 1.5 months with your sugar log.

## 2015-11-27 NOTE — Progress Notes (Signed)
Patient ID: Colleen Henry, female   DOB: [birth year], 55 y.o.   MRN: [id number]  HPI: Colleen Henry is a 55 y.o.-year-old female, returning for f/u for DM2, dx in ~2011, non-insulin-dependent, uncontrolled, without complications. Last visit 3 mo ago.  Last hemoglobin A1c was: Lab Results  Component Value Date   HGBA1C 9.8 08/26/2015   HGBA1C 8.5 04/07/2015   HGBA1C 9.9 12/16/2014  11/2014: HbA1c 9.9% 09/17/2014: HbA1c 12.2% 04/11/2014: HbA1c 8.6%  Pt is on a regimen of: - Metformin XR 1000 mg bid - cannot remember what happened with reg metformin - Farxiga 10 mg daily in am - started 07/2015 >> switched to Invokana but ran out in  - Biocell: Biotin, Zinc, Chromium Off Invokana 100 mg daily (started 11/2014 >> stopped 05/2015!! - concern for kidney ds. She stopped Amaryl - was taking 2 mg before b'fast Insulin was suggested to her >> too expensive 300$/mo  Pt checks her sugars 1x a day and they are (per meter review): - am: 210-240 >> 143-163 >> 130-140, 150 (after coffee or creamer) >> 150-230 >> 205-265 - 2h after b'fast: n/c - before lunch: n/c - 2h after lunch: n/c >> 150s >> n/c >> 170 - before dinner: n/c - 2h after dinner: n/c >> 170-180 >> 150-180 >> n/c - bedtime: n/c - nighttime: n/c No lows. Lowest sugar was 143 >> 150 >> 170; ? hypoglycemia awareness  Highest sugar was 240 >> 262 >> 190 >> 200s >> 265  Glucometer: UltraTrak  Pt's meals are: - Breakfast:  Peanuts or cream cheese + crackers or hard boiled eggs - Lunch:  Spinach salad, feta cheese, grilled chicken, sometimes Subway or hamburger - Dinner:  Chicken, broccoli, sometimes homemade pizza - Snacks: 1-  Peanuts or almonds  - no CKD, last BUN/creatinine:  07/31/2015: 14/0.41, eGFR 162 01/29/2015: ACR 31.6. Lab Results  Component Value Date   BUN 10 01/29/2015   CREATININE 0.50 01/29/2015  On Lisinopril. - last set of lipids: 07/31/2015: 190/75/54/120 09/17/2014: 183/109/47/114 04/11/2014:  189/87/48/124  Not on a statin. - last eye exam was in 03/2015 (Dr Idolina Primer). No DR.  - no numbness and tingling in her feet.  ROS: Constitutional: no weight loss, no fatigue, + nocturia Eyes: no blurry vision, no xerophthalmia ENT: no sore throat, no nodules palpated in throat, no dysphagia/odynophagia, no hoarseness Cardiovascular: no CP/SOB/palpitations/leg swelling Respiratory: no cough/SOB Gastrointestinal: no N/V/D/C Musculoskeletal: no muscle aches/no joint aches Skin: no rashes Neurological: no tremors/numbness/tingling/dizziness + irreg.menses  I reviewed pt's medications, allergies, PMH, social hx, family hx, and changes were documented in the history of present illness. Otherwise, unchanged from my initial visit note.  Past Medical History  Diagnosis Date  . Hypertension   . Complication of anesthesia     agitation upon waking up as a child .   . Diabetes mellitus     oral meds for diabetes . Type 2   Past Surgical History  Procedure Laterality Date  . Tonsillectomy and adenoidectomy  age 28  . Cesarean section  01/16/1998  . I&d extremity    . Ventral hernia repair  04/20/2012    Procedure: LAPAROSCOPIC VENTRAL HERNIA;  Surgeon: Adin Hector, MD;  Location: [facility];  Service: General;  Laterality: N/A;  laparoscopic ventral wall hernia repair  . Insertion of mesh  04/20/2012    Procedure: INSERTION OF MESH;  Surgeon: Adin Hector, MD;  Location: [facility];  Service: General;  Laterality: N/A;  . Ventral hernia repair  04/20/2012  Procedure: HERNIA REPAIR VENTRAL ADULT;  Surgeon: Adin Hector, MD;  Location: [facility];  Service: General;  Laterality: N/A;  Repair incarcerated ventral hernia times two.   History   Social History  . Marital Status: Married    Spouse Name: N/A  . Number of Children: 1   Occupational History  .  office administrator   Social History Main Topics  . Smoking status: Never Smoker   . Smokeless tobacco: Never Used  . Alcohol Use: Yes      Comment: occasionally - socially -   Wine or vodka once or twice a month , 2-3 drinks  . Drug Use: No   Current Outpatient Prescriptions on File Prior to Visit  Medication Sig Dispense Refill  . dapagliflozin propanediol (FARXIGA) 10 MG TABS tablet Take 10 mg by mouth daily. 30 tablet 2  . lisinopril-hydrochlorothiazide (PRINZIDE,ZESTORETIC) 20-25 MG tablet Take 1 tablet by mouth daily.    . medroxyPROGESTERone (PROVERA) 5 MG tablet Take 5 mg by mouth as needed.     . metFORMIN (GLUCOPHAGE-XR) 500 MG 24 hr tablet TAKE 2 TABLETS BY MOUTH TWICE DAILY WITHA MEAL 360 tablet 0  . sertraline (ZOLOFT) 50 MG tablet Take 50 mg by mouth daily.     No current facility-administered medications on file prior to visit.   Allergies  Allergen Reactions  . Penicillins Hives    Tolerates keflex   The patient has a family history of Family History  Problem Relation Age of Onset  . Hypertension Mother   . Hyperlipidemia Mother   . Diabetes Maternal Grandmother   . Heart disease Maternal Grandfather   . Hyperlipidemia Maternal Grandfather    PE: BP 130/78 mmHg  Pulse 75  Ht 5' 0.2" (1.529 m)  Wt 246 lb (111.585 kg)  BMI 47.73 kg/m2  SpO2 97% Body mass index is 47.73 kg/(m^2). Wt Readings from Last 3 Encounters:  11/27/15 246 lb (111.585 kg)  08/26/15 243 lb (110.224 kg)  04/07/15 243 lb (110.224 kg)   Constitutional: obese, in NAD Eyes: PERRLA, EOMI, no exophthalmos ENT: moist mucous membranes, no thyromegaly, no cervical lymphadenopathy Cardiovascular: RRR, No MRG Respiratory: CTA B Gastrointestinal: abdomen soft, NT, ND, BS+ Musculoskeletal: no deformities, strength intact in all 4 Skin: moist, warm, no rashes Neurological: no tremor with outstretched hands, DTR normal in all 4  ASSESSMENT: 1. DM2, non-insulin-dependent, uncontrolled, without complications  PLAN:  1. Patient with long-standing, uncontrolled diabetes, on oral antidiabetic regimen, with metformin and now off  Invokana  >> will restart. Will also add a Glipizide before dinner as I suspect her HS sugars are higher. Advised to check sugars at different times each day. - I suggested to:  Patient Instructions  Please move all Metformin XR at night: 2000 mg with dinner.  Restart Invokana 100 mg daily in am before b'fast.  Start Glipizide 5 mg before dinner - ~ 2 weeks after starting Invokana if sugars are still high.   Please return in 1.5 months with your sugar log.   - advised for yearly eye exams >>  She is UTD - checked HbA1c today >> 9.8% (stable, high) - Return to clinic in 1.5 mo with sugar log >> needs BMP then

## 2016-01-11 ENCOUNTER — Ambulatory Visit: Payer: 59 | Admitting: Internal Medicine

## 2016-01-12 ENCOUNTER — Ambulatory Visit (INDEPENDENT_AMBULATORY_CARE_PROVIDER_SITE_OTHER): Payer: 59 | Admitting: Internal Medicine

## 2016-01-12 ENCOUNTER — Encounter: Payer: Self-pay | Admitting: Internal Medicine

## 2016-01-12 VITALS — BP 132/82 | HR 75 | Ht 60.0 in | Wt 242.0 lb

## 2016-01-12 DIAGNOSIS — E1165 Type 2 diabetes mellitus with hyperglycemia: Secondary | ICD-10-CM

## 2016-01-12 NOTE — Progress Notes (Signed)
Patient ID: Colleen Henry, female   DOB: 08/31/1960, 55 y.o.   MRN: [id number]  HPI: Colleen Henry is a 55 y.o.-year-old female, returning for f/u for DM2, dx in ~2011, non-insulin-dependent, uncontrolled, without complications. Last visit 1.5 mo ago.  She started exercise: walking and water aerobics.  Last hemoglobin A1c was: Lab Results  Component Value Date   HGBA1C 9.8 11/27/2015   HGBA1C 9.8 08/26/2015   HGBA1C 8.5 04/07/2015  11/2014: HbA1c 9.9% 09/17/2014: HbA1c 12.2% 04/11/2014: HbA1c 8.6%  Pt was on a regimen of: - Metformin XR 1000 mg bid - cannot remember what happened with reg metformin - Farxiga 10 mg daily in am - started 07/2015 >> switched to Invokana but ran out  - Biocell: Biotin, Zinc, Chromium She stopped Amaryl - was taking 2 mg before b'fast Insulin was suggested to her >> too expensive 300$/mo  She is now on: - Metformin XR 500 mg in am and 1500 mg with dinner. - Invokana 100 mg daily in am before b'fast. She did not start Glipizide 5 mg before dinner   Pt checks her sugars 1x a day and they are (no log): - am: 210-240 >> 143-163 >> 130-140, 150 (after coffee or creamer) >> 150-230 >> 205-265 >> 150-160 - 2h after b'fast: n/c - before lunch: n/c - 2h after lunch: n/c >> 150s >> n/c >> 170 - before dinner: n/c - 2h after dinner: n/c >> 170-180 >> 150-180 >> n/c - bedtime: n/c - nighttime: n/c No lows. Lowest sugar was 143 >> 150 >> 170 >> 150; ? hypoglycemia awareness  Highest sugar was 240 >> 262 >> 190 >> 200s >> 265 >> 190  Glucometer: UltraTrak  Pt's meals are: - Breakfast:  Peanuts or cream cheese + crackers or hard boiled eggs - Lunch:  Spinach salad, feta cheese, grilled chicken, sometimes Subway or hamburger - Dinner:  Chicken, broccoli, sometimes homemade pizza - Snacks: 1-  Peanuts or almonds  - no CKD, last BUN/creatinine:  07/31/2015: 14/0.41, eGFR 162 01/29/2015: ACR 31.6. Lab Results  Component Value Date   BUN 10 01/29/2015    CREATININE 0.50 01/29/2015  On Lisinopril. - last set of lipids: 07/31/2015: 190/75/54/120 09/17/2014: 183/109/47/114 04/11/2014: 189/87/48/124  Not on a statin. - last eye exam was in 03/2015 (Dr Dunn). No DR.  - no numbness and tingling in her feet.  ROS: Constitutional: no weight loss, no fatigue, + nocturia Eyes: no blurry vision, no xerophthalmia ENT: no sore throat, no nodules palpated in throat, no dysphagia/odynophagia, no hoarseness Cardiovascular: no CP/SOB/palpitations/leg swelling Respiratory: no cough/SOB Gastrointestinal: no N/V/D/C Musculoskeletal: no muscle aches/no joint aches Skin: no rashes Neurological: no tremors/numbness/tingling/dizziness + irreg.menses  I reviewed pt's medications, allergies, PMH, social hx, family hx, and changes were documented in the history of present illness. Otherwise, unchanged from my initial visit note.  Past Medical History:  Diagnosis Date  . Complication of anesthesia    agitation upon waking up as a child .   . Diabetes mellitus    oral meds for diabetes . Type 2  . Hypertension    Past Surgical History:  Procedure Laterality Date  . CESAREAN SECTION  01/16/1998  . I&D EXTREMITY    . INSERTION OF MESH  04/20/2012   Procedure: INSERTION OF MESH;  Surgeon: Steven C. Gross, MD;  Location: [facility];  Service: General;  Laterality: N/A;  . TONSILLECTOMY AND ADENOIDECTOMY  age 11  . VENTRAL HERNIA REPAIR  04/20/2012   Procedure: LAPAROSCOPIC VENTRAL HERNIA;    Surgeon: Adin Hector, MD;  Location: [facility];  Service: General;  Laterality: N/A;  laparoscopic ventral wall hernia repair  . VENTRAL HERNIA REPAIR  04/20/2012   Procedure: HERNIA REPAIR VENTRAL ADULT;  Surgeon: Adin Hector, MD;  Location: [facility];  Service: General;  Laterality: N/A;  Repair incarcerated ventral hernia times two.   History   Social History  . Marital Status: Married    Spouse Name: N/A  . Number of Children: 1   Occupational History  .  office  administrator   Social History Main Topics  . Smoking status: Never Smoker   . Smokeless tobacco: Never Used  . Alcohol Use: Yes     Comment: occasionally - socially -   Wine or vodka once or twice a month , 2-3 drinks  . Drug Use: No   Current Outpatient Prescriptions on File Prior to Visit  Medication Sig Dispense Refill  . canagliflozin (INVOKANA) 100 MG TABS tablet Take 1 tablet (100 mg total) by mouth daily. 30 tablet 2  . glipiZIDE (GLUCOTROL) 5 MG tablet Take 1 tablet (5 mg total) by mouth daily before supper. 90 tablet 1  . lisinopril-hydrochlorothiazide (PRINZIDE,ZESTORETIC) 20-25 MG tablet Take 1 tablet by mouth daily.    . medroxyPROGESTERone (PROVERA) 5 MG tablet Take 5 mg by mouth as needed.     . metFORMIN (GLUCOPHAGE-XR) 500 MG 24 hr tablet TAKE 2 TABLETS BY MOUTH TWICE DAILY WITHA MEAL 360 tablet 0  . sertraline (ZOLOFT) 50 MG tablet Take 50 mg by mouth daily.     No current facility-administered medications on file prior to visit.    Allergies  Allergen Reactions  . Penicillins Hives    Tolerates keflex   The patient has a family history of Family History  Problem Relation Age of Onset  . Hypertension Mother   . Hyperlipidemia Mother   . Diabetes Maternal Grandmother   . Heart disease Maternal Grandfather   . Hyperlipidemia Maternal Grandfather    PE: BP 132/82 (BP Location: Left Arm, Patient Position: Sitting)   Pulse 75   Ht 5' (1.524 m)   Wt 242 lb (109.8 kg)   SpO2 96%   BMI 47.26 kg/m  Body mass index is 47.26 kg/m. Wt Readings from Last 3 Encounters:  01/12/16 242 lb (109.8 kg)  11/27/15 246 lb (111.6 kg)  08/26/15 243 lb (110.2 kg)   Constitutional: obese, in NAD Eyes: PERRLA, EOMI, no exophthalmos ENT: moist mucous membranes, no thyromegaly, no cervical lymphadenopathy Cardiovascular: RRR, No MRG Respiratory: CTA B Gastrointestinal: abdomen soft, NT, ND, BS+ Musculoskeletal: no deformities, strength intact in all 4 Skin: moist, warm, no  rashes Neurological: no tremor with outstretched hands, DTR normal in all 4  ASSESSMENT: 1. DM2, non-insulin-dependent, uncontrolled, without complications  PLAN:  1. Patient with long-standing, uncontrolled diabetes, on oral antidiabetic regimen, with metformin and now back onInvokana. Sugars are definitely better, but she only checks once a day >> difficult to assess control as she only checks in am. I advised her to check later in the day, also, to see if we need a Glipizide before dinner. Will also try to move all Metformin at night. - reviewed HbA1c from last visit: 9.8% (stable, high) - I suggested to:  Patient Instructions  Please try to move all Metformin at night (2000 mg). Continue Invokana 100 mg daily in am.  Check sugars 1x a day, but rotate check times.  Please return in 1.5 months with your sugar log.   -  advised for yearly eye exams >>  She is UTD - will check a BMP at next appt with PCP next month - Return to clinic in 1.5 mo with sugar log  Cristina Gherghe, MD PhD [facility]   

## 2016-01-12 NOTE — Patient Instructions (Addendum)
Please try to move all Metformin at night (2000 mg). Continue Invokana 100 mg daily in am.  Check sugars 1x a day, but rotate check times.  Please return in 1.5 months with your sugar log.

## 2016-02-09 ENCOUNTER — Other Ambulatory Visit: Payer: Self-pay | Admitting: Internal Medicine

## 2016-02-24 ENCOUNTER — Other Ambulatory Visit: Payer: Self-pay | Admitting: Orthopedic Surgery

## 2016-02-25 ENCOUNTER — Other Ambulatory Visit: Payer: Self-pay | Admitting: Orthopedic Surgery

## 2016-03-03 ENCOUNTER — Ambulatory Visit: Payer: 59 | Admitting: Internal Medicine

## 2016-03-19 ENCOUNTER — Other Ambulatory Visit: Payer: Self-pay | Admitting: Internal Medicine

## 2016-04-13 ENCOUNTER — Encounter (HOSPITAL_BASED_OUTPATIENT_CLINIC_OR_DEPARTMENT_OTHER): Payer: Self-pay | Admitting: *Deleted

## 2016-04-13 NOTE — Progress Notes (Signed)
   04/13/16 1321  OBSTRUCTIVE SLEEP APNEA  Have you ever been diagnosed with sleep apnea through a sleep study? No  Do you snore loudly (loud enough to be heard through closed doors)?  1  Do you often feel tired, fatigued, or sleepy during the daytime (such as falling asleep during driving or talking to someone)? 1  Has anyone observed you stop breathing during your sleep? 0  Do you have, or are you being treated for high blood pressure? 0  BMI more than 35 kg/m2? 1  Age > 50 (1-yes) 1  Neck circumference greater than:Female 16 inches or larger, Female 17inches or larger? 0  Female Gender (Yes=1) 0  Obstructive Sleep Apnea Score 4

## 2016-04-14 ENCOUNTER — Ambulatory Visit: Payer: 59 | Admitting: Internal Medicine

## 2016-04-18 ENCOUNTER — Encounter (HOSPITAL_BASED_OUTPATIENT_CLINIC_OR_DEPARTMENT_OTHER)
Admission: RE | Admit: 2016-04-18 | Discharge: 2016-04-18 | Disposition: A | Discharge status: Home / Self Care | Payer: Managed Care, Other (non HMO) | Source: Ambulatory Visit | Attending: Orthopedic Surgery | Admitting: Orthopedic Surgery

## 2016-04-18 DIAGNOSIS — D367 Benign neoplasm of other specified sites: Secondary | ICD-10-CM | POA: Diagnosis not present

## 2016-04-18 DIAGNOSIS — Z7984 Long term (current) use of oral hypoglycemic drugs: Secondary | ICD-10-CM | POA: Diagnosis not present

## 2016-04-18 DIAGNOSIS — Z6841 Body Mass Index (BMI) 40.0 and over, adult: Secondary | ICD-10-CM | POA: Diagnosis not present

## 2016-04-18 DIAGNOSIS — I10 Essential (primary) hypertension: Secondary | ICD-10-CM | POA: Diagnosis not present

## 2016-04-18 DIAGNOSIS — E119 Type 2 diabetes mellitus without complications: Secondary | ICD-10-CM | POA: Diagnosis not present

## 2016-04-18 DIAGNOSIS — Z79899 Other long term (current) drug therapy: Secondary | ICD-10-CM | POA: Diagnosis not present

## 2016-04-18 LAB — BASIC METABOLIC PANEL
ANION GAP: 9 (ref 5–15)
BUN: 13 mg/dL (ref 6–20)
CALCIUM: 9.3 mg/dL (ref 8.9–10.3)
CO2: 31 mmol/L (ref 22–32)
CREATININE: 0.43 mg/dL — AB (ref 0.44–1.00)
Chloride: 99 mmol/L — ABNORMAL LOW (ref 101–111)
Glucose, Bld: 200 mg/dL — ABNORMAL HIGH (ref 65–99)
Potassium: 3.9 mmol/L (ref 3.5–5.1)
SODIUM: 139 mmol/L (ref 135–145)

## 2016-04-18 NOTE — Anesthesia Preprocedure Evaluation (Addendum)
Anesthesia Evaluation  Patient identified by MRN, date of birth, ID band Patient awake    Reviewed: Allergy & Precautions, NPO status , Patient's Chart, lab work & pertinent test results  History of Anesthesia Complications (+) history of anesthetic complications  Airway Mallampati: I       Dental  (+) Teeth Intact   Pulmonary neg pulmonary ROS,    breath sounds clear to auscultation       Cardiovascular hypertension, negative cardio ROS   Rhythm:Regular Rate:Normal     Neuro/Psych PSYCHIATRIC DISORDERS Depression negative neurological ROS     GI/Hepatic negative GI ROS, Neg liver ROS,   Endo/Other  diabetes, Type 2, Oral Hypoglycemic AgentsMorbid obesity  Renal/GU negative Renal ROS     Musculoskeletal negative musculoskeletal ROS (+)   Abdominal (+) + obese,   Peds  Hematology negative hematology ROS (+)   Anesthesia Other Findings   Reproductive/Obstetrics negative OB ROS                            Anesthesia Physical  Anesthesia Plan  ASA: III  Anesthesia Plan: Bier Block and MAC   Post-op Pain Management:    Induction:   Airway Management Planned: Oral ETT  Additional Equipment:   Intra-op Plan:   Post-operative Plan:   Informed Consent: I have reviewed the patients History and Physical, chart, labs and discussed the procedure including the risks, benefits and alternatives for the proposed anesthesia with the patient or authorized representative who has indicated his/her understanding and acceptance.   Dental advisory given  Plan Discussed with: Surgeon and CRNA  Anesthesia Plan Comments:                                          Anesthesia Evaluation    Airway Mallampati: I      Dental  (+) Teeth Intact   Pulmonary neg pulmonary ROS,          Cardiovascular hypertension, Rhythm:Regular Rate:Normal     Neuro/Psych     GI/Hepatic negative GI ROS,   Endo/Other  diabetesMorbid obesity  Renal/GU negative Renal ROS     Musculoskeletal   Abdominal (+) + obese,   Peds  Hematology negative hematology ROS (+)   Anesthesia Other Findings   Reproductive/Obstetrics                           Anesthesia Physical Anesthesia Plan  ASA: II  Anesthesia Plan: General   Post-op Pain Management:    Induction: Intravenous  Airway Management Planned: Oral ETT  Additional Equipment:   Intra-op Plan:   Post-operative Plan: Extubation in OR  Informed Consent:   Dental advisory given  Plan Discussed with: Surgeon  Anesthesia Plan Comments:         Anesthesia Quick Evaluation  Anesthesia Quick Evaluation

## 2016-04-18 NOTE — Progress Notes (Signed)
Dr Conrad Gaston completed anesthesia consult with pt. And reviewed all meds and pt. Hx.  Pt. Cleared for surgery with Dr Fredna Dow on 04/19/16.

## 2016-04-19 ENCOUNTER — Ambulatory Visit (HOSPITAL_BASED_OUTPATIENT_CLINIC_OR_DEPARTMENT_OTHER)
Admission: RE | Admit: 2016-04-19 | Discharge: 2016-04-19 | Disposition: A | Discharge status: Home / Self Care | Payer: Managed Care, Other (non HMO) | Source: Ambulatory Visit | Attending: Orthopedic Surgery | Admitting: Orthopedic Surgery

## 2016-04-19 ENCOUNTER — Ambulatory Visit (HOSPITAL_BASED_OUTPATIENT_CLINIC_OR_DEPARTMENT_OTHER): Payer: Managed Care, Other (non HMO) | Admitting: Anesthesiology

## 2016-04-19 ENCOUNTER — Encounter (HOSPITAL_BASED_OUTPATIENT_CLINIC_OR_DEPARTMENT_OTHER): Payer: Self-pay | Admitting: *Deleted

## 2016-04-19 ENCOUNTER — Encounter (HOSPITAL_BASED_OUTPATIENT_CLINIC_OR_DEPARTMENT_OTHER)
Admission: RE | Disposition: A | Discharge status: Home / Self Care | Payer: Self-pay | Source: Ambulatory Visit | Attending: Orthopedic Surgery

## 2016-04-19 DIAGNOSIS — D367 Benign neoplasm of other specified sites: Secondary | ICD-10-CM | POA: Insufficient documentation

## 2016-04-19 DIAGNOSIS — Z7984 Long term (current) use of oral hypoglycemic drugs: Secondary | ICD-10-CM | POA: Insufficient documentation

## 2016-04-19 DIAGNOSIS — Z79899 Other long term (current) drug therapy: Secondary | ICD-10-CM | POA: Insufficient documentation

## 2016-04-19 DIAGNOSIS — Z6841 Body Mass Index (BMI) 40.0 and over, adult: Secondary | ICD-10-CM | POA: Insufficient documentation

## 2016-04-19 DIAGNOSIS — E119 Type 2 diabetes mellitus without complications: Secondary | ICD-10-CM | POA: Insufficient documentation

## 2016-04-19 DIAGNOSIS — I10 Essential (primary) hypertension: Secondary | ICD-10-CM | POA: Insufficient documentation

## 2016-04-19 HISTORY — DX: Depression, unspecified: F32.A

## 2016-04-19 HISTORY — DX: Snoring: R06.83

## 2016-04-19 HISTORY — PX: MASS EXCISION: SHX2000

## 2016-04-19 HISTORY — DX: Major depressive disorder, single episode, unspecified: F32.9

## 2016-04-19 LAB — GLUCOSE, CAPILLARY
GLUCOSE-CAPILLARY: 222 mg/dL — AB (ref 65–99)
GLUCOSE-CAPILLARY: 201 mg/dL — AB (ref 65–99)

## 2016-04-19 SURGERY — EXCISION MASS
Anesthesia: Monitor Anesthesia Care | Site: Finger | Laterality: Right

## 2016-04-19 MED ORDER — FENTANYL CITRATE (PF) 100 MCG/2ML IJ SOLN
50.0000 ug | INTRAMUSCULAR | Status: DC | PRN
  Administered 2016-04-19: 100 ug via INTRAVENOUS

## 2016-04-19 MED ORDER — OXYCODONE HCL 5 MG/5ML PO SOLN: 5.0000 mg | Freq: Once | ORAL | Status: DC | PRN

## 2016-04-19 MED ORDER — MIDAZOLAM HCL 2 MG/2ML IJ SOLN
1.0000 mg | INTRAMUSCULAR | Status: DC | PRN
  Administered 2016-04-19: 2 mg via INTRAVENOUS

## 2016-04-19 MED ORDER — SCOPOLAMINE 1 MG/3DAYS TD PT72
1.0000 | MEDICATED_PATCH | Freq: Once | TRANSDERMAL | Status: DC | PRN
Start: 2016-04-19 — End: 2016-04-19

## 2016-04-19 MED ORDER — DEXAMETHASONE SODIUM PHOSPHATE 10 MG/ML IJ SOLN
INTRAMUSCULAR | Status: AC
  Filled 2016-04-19: qty 2

## 2016-04-19 MED ORDER — PROPOFOL 500 MG/50ML IV EMUL
INTRAVENOUS | Status: DC | PRN
  Administered 2016-04-19: 75 ug/kg/min via INTRAVENOUS

## 2016-04-19 MED ORDER — LIDOCAINE 2% (20 MG/ML) 5 ML SYRINGE
INTRAMUSCULAR | Status: AC
  Filled 2016-04-19: qty 5

## 2016-04-19 MED ORDER — MIDAZOLAM HCL 2 MG/2ML IJ SOLN
INTRAMUSCULAR | Status: AC
  Filled 2016-04-19: qty 2

## 2016-04-19 MED ORDER — BUPIVACAINE HCL (PF) 0.25 % IJ SOLN
INTRAMUSCULAR | Status: AC
  Filled 2016-04-19: qty 120

## 2016-04-19 MED ORDER — CEFAZOLIN SODIUM-DEXTROSE 2-3 GM-% IV SOLR
INTRAVENOUS | Status: DC | PRN
  Administered 2016-04-19: 2 g via INTRAVENOUS

## 2016-04-19 MED ORDER — ONDANSETRON HCL 4 MG/2ML IJ SOLN
INTRAMUSCULAR | Status: AC
  Filled 2016-04-19: qty 2

## 2016-04-19 MED ORDER — BUPIVACAINE HCL (PF) 0.25 % IJ SOLN
INTRAMUSCULAR | Status: DC | PRN
  Administered 2016-04-19: 8 mL

## 2016-04-19 MED ORDER — VANCOMYCIN HCL IN DEXTROSE 1-5 GM/200ML-% IV SOLN: 1000.0000 mg | INTRAVENOUS | Status: DC

## 2016-04-19 MED ORDER — HYDROCODONE-ACETAMINOPHEN 5-325 MG PO TABS: 1.0000 | ORAL_TABLET | Freq: Four times a day (QID) | ORAL | 0 refills | Status: DC | PRN

## 2016-04-19 MED ORDER — VANCOMYCIN HCL IN DEXTROSE 1-5 GM/200ML-% IV SOLN
INTRAVENOUS | Status: AC
  Filled 2016-04-19: qty 200

## 2016-04-19 MED ORDER — OXYCODONE HCL 5 MG PO TABS: 5.0000 mg | ORAL_TABLET | Freq: Once | ORAL | Status: DC | PRN

## 2016-04-19 MED ORDER — LACTATED RINGERS IV SOLN
INTRAVENOUS | Status: DC
  Administered 2016-04-19 (×2): via INTRAVENOUS

## 2016-04-19 MED ORDER — PROMETHAZINE HCL 25 MG/ML IJ SOLN: 6.2500 mg | INTRAMUSCULAR | Status: DC | PRN

## 2016-04-19 MED ORDER — MEPERIDINE HCL 25 MG/ML IJ SOLN: 6.2500 mg | INTRAMUSCULAR | Status: DC | PRN

## 2016-04-19 MED ORDER — ONDANSETRON HCL 4 MG/2ML IJ SOLN
INTRAMUSCULAR | Status: DC | PRN
Start: 2016-04-19 — End: 2016-04-19
  Administered 2016-04-19: 4 mg via INTRAVENOUS

## 2016-04-19 MED ORDER — CHLORHEXIDINE GLUCONATE 4 % EX LIQD: 60.0000 mL | Freq: Once | CUTANEOUS | Status: DC

## 2016-04-19 MED ORDER — LIDOCAINE HCL (CARDIAC) 20 MG/ML IV SOLN
INTRAVENOUS | Status: DC | PRN
  Administered 2016-04-19: 30 mg via INTRAVENOUS

## 2016-04-19 MED ORDER — FENTANYL CITRATE (PF) 100 MCG/2ML IJ SOLN
INTRAMUSCULAR | Status: AC
  Filled 2016-04-19: qty 2

## 2016-04-19 MED ORDER — HYDROMORPHONE HCL 1 MG/ML IJ SOLN: 0.2500 mg | INTRAMUSCULAR | Status: DC | PRN

## 2016-04-19 MED ORDER — PROPOFOL 500 MG/50ML IV EMUL
INTRAVENOUS | Status: AC
Start: 2016-04-19 — End: 2016-04-19
  Filled 2016-04-19: qty 50

## 2016-04-19 SURGICAL SUPPLY — 46 items
BANDAGE COBAN STERILE 2 (GAUZE/BANDAGES/DRESSINGS) IMPLANT
BLADE SURG 15 STRL LF DISP TIS (BLADE) ×1 IMPLANT
BLADE SURG 15 STRL SS (BLADE) ×1
BNDG COHESIVE 1X5 TAN STRL LF (GAUZE/BANDAGES/DRESSINGS) ×2 IMPLANT
BNDG COHESIVE 3X5 TAN STRL LF (GAUZE/BANDAGES/DRESSINGS) IMPLANT
BNDG ESMARK 4X9 LF (GAUZE/BANDAGES/DRESSINGS) ×2 IMPLANT
BNDG GAUZE ELAST 4 BULKY (GAUZE/BANDAGES/DRESSINGS) ×2 IMPLANT
CHLORAPREP W/TINT 26ML (MISCELLANEOUS) ×2 IMPLANT
CORDS BIPOLAR (ELECTRODE) ×2 IMPLANT
COVER BACK TABLE 60X90IN (DRAPES) ×2 IMPLANT
COVER MAYO STAND STRL (DRAPES) ×2 IMPLANT
CUFF TOURNIQUET SINGLE 18IN (TOURNIQUET CUFF) ×2 IMPLANT
DECANTER SPIKE VIAL GLASS SM (MISCELLANEOUS) IMPLANT
DRAIN PENROSE 1/2X12 LTX STRL (WOUND CARE) IMPLANT
DRAPE EXTREMITY T 121X128X90 (DRAPE) ×2 IMPLANT
DRAPE SURG 17X23 STRL (DRAPES) ×2 IMPLANT
GAUZE SPONGE 4X4 12PLY STRL (GAUZE/BANDAGES/DRESSINGS) ×2 IMPLANT
GAUZE XEROFORM 1X8 LF (GAUZE/BANDAGES/DRESSINGS) ×2 IMPLANT
GLOVE BIOGEL PI IND STRL 8 (GLOVE) ×1 IMPLANT
GLOVE BIOGEL PI IND STRL 8.5 (GLOVE) ×1 IMPLANT
GLOVE BIOGEL PI INDICATOR 8 (GLOVE) ×1
GLOVE BIOGEL PI INDICATOR 8.5 (GLOVE) ×1
GLOVE SURG ORTHO 8.0 STRL STRW (GLOVE) ×2 IMPLANT
GLOVE SURG SYN 7.5  E (GLOVE) ×1
GLOVE SURG SYN 7.5 E (GLOVE) ×1 IMPLANT
GOWN STRL REUS W/ TWL LRG LVL3 (GOWN DISPOSABLE) IMPLANT
GOWN STRL REUS W/TWL 2XL LVL3 (GOWN DISPOSABLE) ×2 IMPLANT
GOWN STRL REUS W/TWL LRG LVL3 (GOWN DISPOSABLE)
GOWN STRL REUS W/TWL XL LVL3 (GOWN DISPOSABLE) ×2 IMPLANT
NDL SAFETY ECLIPSE 18X1.5 (NEEDLE) IMPLANT
NEEDLE HYPO 18GX1.5 SHARP (NEEDLE)
NEEDLE PRECISIONGLIDE 27X1.5 (NEEDLE) ×2 IMPLANT
NS IRRIG 1000ML POUR BTL (IV SOLUTION) ×2 IMPLANT
PACK BASIN DAY SURGERY FS (CUSTOM PROCEDURE TRAY) ×2 IMPLANT
PAD CAST 3X4 CTTN HI CHSV (CAST SUPPLIES) IMPLANT
PADDING CAST COTTON 3X4 STRL (CAST SUPPLIES)
SPLINT FINGER 3.25 BULB 911905 (SOFTGOODS) ×2 IMPLANT
SPLINT PLASTER CAST XFAST 3X15 (CAST SUPPLIES) IMPLANT
SPLINT PLASTER XTRA FASTSET 3X (CAST SUPPLIES)
STOCKINETTE 4X48 STRL (DRAPES) ×2 IMPLANT
SUT ETHILON 4 0 PS 2 18 (SUTURE) ×2 IMPLANT
SUT VIC AB 4-0 P2 18 (SUTURE) IMPLANT
SYR BULB 3OZ (MISCELLANEOUS) ×2 IMPLANT
SYR CONTROL 10ML LL (SYRINGE) ×2 IMPLANT
TOWEL OR 17X24 6PK STRL BLUE (TOWEL DISPOSABLE) ×2 IMPLANT
UNDERPAD 30X30 (UNDERPADS AND DIAPERS) ×2 IMPLANT

## 2016-04-19 NOTE — Op Note (Signed)
Other Dictation: Dictation Number dictated 06/20/15 @ 9:35

## 2016-04-19 NOTE — H&P (Signed)
Colleen Henry is an 55 y.o. female.   Chief Complaint: mass right small finger HPI: Colleen Henry is a 55 year old right-hand-dominant female referred by Dr. Kirby Crigler for consultation regarding a mass on her right small finger distal interphalangeal joint. This been present for 3-5 years. She has no history of injury. She states that it does not cause any pain for her. She states that it does feel tight however. She has a history diabetes no history of thyroid problems arthritis gout. Family history is positive diabetes and arthritis and negative for thyroid problems and gout. She is not complaining of any numbness or tingling.      Past Medical History:  Diagnosis Date  . Complication of anesthesia    agitation upon waking up as a child .   Marland Kitchen Depression   . Diabetes mellitus    oral meds for diabetes . Type 2  . Hypertension   . Snores     Past Surgical History:  Procedure Laterality Date  . CESAREAN SECTION  01/16/1998  . HERNIA REPAIR  2013  . I&D EXTREMITY    . INSERTION OF MESH  04/20/2012   Procedure: INSERTION OF MESH;  Surgeon: Adin Hector, MD;  Location: [facility];  Service: General;  Laterality: N/A;  . TONSILLECTOMY AND ADENOIDECTOMY  age 6  . VENTRAL HERNIA REPAIR  04/20/2012   Procedure: LAPAROSCOPIC VENTRAL HERNIA;  Surgeon: Adin Hector, MD;  Location: [facility];  Service: General;  Laterality: N/A;  laparoscopic ventral wall hernia repair  . VENTRAL HERNIA REPAIR  04/20/2012   Procedure: HERNIA REPAIR VENTRAL ADULT;  Surgeon: Adin Hector, MD;  Location: [facility];  Service: General;  Laterality: N/A;  Repair incarcerated ventral hernia times two.    Family History  Problem Relation Age of Onset  . Hypertension Mother   . Hyperlipidemia Mother   . Diabetes Maternal Grandmother   . Heart disease Maternal Grandfather   . Hyperlipidemia Maternal Grandfather    Social History:  reports that she has never smoked. She has never used smokeless tobacco. She reports that she  drinks alcohol. She reports that she does not use drugs.  Allergies:  Allergies  Allergen Reactions  . Penicillins Hives    Tolerates keflex    No prescriptions prior to admission.    Results for orders placed or performed during the hospital encounter of 04/19/16 (from the past 48 hour(s))  Basic metabolic panel     Status: Abnormal   Collection Time: 04/18/16 11:50 AM  Result Value Ref Range   Sodium 139 135 - 145 mmol/L   Potassium 3.9 3.5 - 5.1 mmol/L   Chloride 99 (L) 101 - 111 mmol/L   CO2 31 22 - 32 mmol/L   Glucose, Bld 200 (H) 65 - 99 mg/dL   BUN 13 6 - 20 mg/dL   Creatinine, Ser 0.43 (L) 0.44 - 1.00 mg/dL   Calcium 9.3 8.9 - 10.3 mg/dL   GFR calc non Af Amer >60 >60 mL/min   GFR calc Af Amer >60 >60 mL/min    Comment: (NOTE) The eGFR has been calculated using the CKD EPI equation. This calculation has not been validated in all clinical situations. eGFR's persistently <60 mL/min signify possible Chronic Kidney Disease.    Anion gap 9 5 - 15    No results found.   Pertinent items are noted in HPI.  Height 5' (1.524 m), weight 111.1 kg (245 lb), last menstrual period 11/12/2015.  General appearance: alert, cooperative and  appears stated age Head: Normocephalic, without obvious abnormality Neck: no JVD Resp: clear to auscultation bilaterally Cardio: regular rate and rhythm, S1, S2 normal, no murmur, click, rub or gallop GI: soft, non-tender; bowel sounds normal; no masses,  no organomegaly Extremities: mass right small finger distal phalanx Pulses: 2+ and symmetric Skin: Skin color, texture, turgor normal. No rashes or lesions Neurologic: Grossly normal Incision/Wound: na  Assessment/Plan X-rays AP and lateral of the right small finger reveal the soft tissue mass without any significant arthritic change.  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 She is advised that does not appear to be a cyst. There is  always potential could be an epidermal inclusion cyst or gouty tophus.  Plan: We have discussed the possibility of surgical excision of this with her. Prepare and postoperative course were discussed along with risk complications. She is aware that there is no guarantee to the surgery. Possibility of infection recurrence injury to arteries nerves tendons. She is advised that there is no way to tell the exact location of the arteries and nerves. That there is always potential with the mass being on both radial and ulnar aspects of loss of the finger due to injury to both neurovascular bundles. She questions are encouraged and answered to her satisfaction. She would like to proceed to have this excised. This can be scheduled as an outpatient under regional anesthesia.        Orlie Cundari R 04/19/2016, 4:55 AM

## 2016-04-19 NOTE — Anesthesia Procedure Notes (Signed)
Procedure Name: MAC Date/Time: 04/19/2016 8:35 AM Performed by: Jewelz Ricklefs D Pre-anesthesia Checklist: Patient identified, Emergency Drugs available, Suction available, Patient being monitored and Timeout performed Patient Re-evaluated:Patient Re-evaluated prior to inductionOxygen Delivery Method: Simple face mask

## 2016-04-19 NOTE — Brief Op Note (Signed)
04/19/2016  9:30 AM  PATIENT:  Colleen Henry  55 y.o. female  PRE-OPERATIVE DIAGNOSIS:  MASS RIGHT SMALL FINGER    D-21.11  POST-OPERATIVE DIAGNOSIS:  MASS RIGHT SMALL FINGER    D-21.11  PROCEDURE:  Procedure(s): RIGHT SMALL FINGER EXCISION MASS (Right)  SURGEON:  Surgeon(s) and Role:    * Daryll Brod, MD - Primary  PHYSICIAN ASSISTANT:   ASSISTANTS: none   ANESTHESIA:   local and regional  EBL:  No intake/output data recorded.  BLOOD ADMINISTERED:none  DRAINS: none   LOCAL MEDICATIONS USED:  BUPIVICAINE   SPECIMEN:  Excision  DISPOSITION OF SPECIMEN:  PATHOLOGY  COUNTS:  YES  TOURNIQUET:   Total Tourniquet Time Documented: Forearm (Right) - 41 minutes Total: Forearm (Right) - 41 minutes   DICTATION: .Other Dictation: Dictation Number dictated 06/20/15 @ 9:35  PLAN OF CARE: Discharge to home after PACU  PATIENT DISPOSITION:  PACU - hemodynamically stable.

## 2016-04-19 NOTE — Transfer of Care (Signed)
Immediate Anesthesia Transfer of Care Note  Patient: Colleen Henry  Procedure(s) Performed: Procedure(s): RIGHT SMALL FINGER EXCISION MASS (Right)  Patient Location: PACU  Anesthesia Type:Bier block  Level of Consciousness: awake, alert , oriented and patient cooperative  Airway & Oxygen Therapy: Patient Spontanous Breathing and Patient connected to face mask oxygen  Post-op Assessment: Report given to RN and Post -op Vital signs reviewed and stable  Post vital signs: Reviewed and stable  Last Vitals:  Vitals:   04/19/16 0701 04/19/16 0928  BP: (!) 161/83   Pulse: 80 67  Resp: 18 20  Temp: 36.7 C     Last Pain:  Vitals:   04/19/16 0701  TempSrc: Oral         Complications: No apparent anesthesia complications

## 2016-04-19 NOTE — Anesthesia Postprocedure Evaluation (Signed)
Anesthesia Post Note  Patient: Colleen Henry  Procedure(s) Performed: Procedure(s) (LRB): RIGHT SMALL FINGER EXCISION MASS (Right)  Patient location during evaluation: PACU Anesthesia Type: MAC and Bier Block Level of consciousness: awake and alert Pain management: pain level controlled Vital Signs Assessment: post-procedure vital signs reviewed and stable Respiratory status: spontaneous breathing Cardiovascular status: stable Anesthetic complications: no    Last Vitals:  Vitals:   04/19/16 1000 04/19/16 1013  BP:  130/80  Pulse: 63 76  Resp: (!) 21 18  Temp:  37 C    Last Pain:  Vitals:   04/19/16 0957  TempSrc:   PainSc: 0-No pain                 Nolon Nations

## 2016-04-19 NOTE — Discharge Instructions (Addendum)

## 2016-04-19 NOTE — Op Note (Signed)
NAMEALEXCIS, Colleen Henry               ACCOUNT NO.:  [id number]  MEDICAL RECORD NO.:  [id number]  LOCATION:                                 FACILITY:  PHYSICIAN:  Daryll Brod, M.D.            DATE OF BIRTH:  DATE OF PROCEDURE:  04/19/2016 DATE OF DISCHARGE:                              OPERATIVE REPORT   PREOPERATIVE DIAGNOSIS:  Tumor, small finger distal phalanx, right hand.  POSTOPERATIVE DIAGNOSIS:  Tumor, small finger distal phalanx, right hand.  OPERATION:  Excisional biopsy of mass, right small finger.  SURGEON:  Daryll Brod, M.D.  ANESTHESIA:  Forearm-based IV regional with local infiltration.  PLACE OF SURGERY:  [facility].  HISTORY:  The patient is a 55 year old female with a history of a large mass covering the entire distal phalanx along lateral extension of her right small finger.  This does not transilluminate.  X-rays do not reveal significant degenerative changes.  She is admitted for excision and that she is desirous having this removed.  She is aware that there is no guarantee to the surgery, the possibility of infection; recurrence of injury to arteries, nerves, tendons; incomplete relief of symptoms; dystrophy; the loss of finger tip; numbness; and tingling.  In the preoperative area, the patient is seen.  The extremity marked by both patient and surgeon.  Antibiotic given.  PROCEDURE IN DETAIL:  The patient was brought to the operating room, where a forearm-based IV regional anesthetic was carried out without difficulty under the direction of the Anesthesia Department.  She was prepped using ChloraPrep in the supine position with the right arm free. A 3-minute dry time was allowed and the time-out taken, confirming the patient and procedure.  A volar Bruner incision was made with apex on the radial aspect of the middle and distal phalanx of the right small finger.  This was carried down through the skin to the subcutaneous tissue.  A multilobulated  brown tan yellow tumor was immediately encountered.  The radial neurovascular bundle appeared to penetrate through the central aspect of the mass.  There was a distal extension to this over the distal phalanx and in extension onto the middle phalanx. There was an extension of the dorsal radial aspect.  With blunt and sharp dissection, it was gradually dissected free maintaining the subcutaneous tissue to the flap.  The ulnar neurovascular bundle did not appear to be involved.  The mass was then brought underneath the skin trying to protect any small branches from the vascular supply both dorsally and palmarly.  The extension onto the dorsal aspect of the finger was then brought beneath the neurovascular bundle from the radial side.  This allowed delivery of the entire mass proximally into the proximal wound.  The mass measured 2.6 cm in length.  This was sent to Pathology.  It was entirely excised.  Care was taken to prevent any injury to neurovascular bundles radially and ulnarly.  The wound was copiously irrigated with saline.  This was then closed with interrupted 4-0 nylon sutures.  A compressive dressing was partially applied and the tourniquet deflated.  The finger tip pinked up.  At  the beginning of the procedure, a metacarpal block was given 0.25% bupivacaine without epinephrine.  8 mL was used.  The dressing was completed along with a dorsal splint over the middle distal phalanx.  The patient tolerated the procedure well and was taken to the recovery room for observation in satisfactory condition.  She will be discharged to home to return to the [facility] in 1 week, on [place].          ______________________________ Daryll Brod, M.D.     GK/MEDQ  D:  04/19/2016  T:  04/19/2016  Job:  [id number]

## 2016-04-20 ENCOUNTER — Encounter (HOSPITAL_BASED_OUTPATIENT_CLINIC_OR_DEPARTMENT_OTHER): Payer: Self-pay | Admitting: Orthopedic Surgery

## 2016-05-04 ENCOUNTER — Other Ambulatory Visit: Payer: Self-pay | Admitting: Obstetrics and Gynecology

## 2016-05-04 DIAGNOSIS — Z1231 Encounter for screening mammogram for malignant neoplasm of breast: Secondary | ICD-10-CM

## 2016-05-19 LAB — HM DIABETES EYE EXAM

## 2016-06-07 ENCOUNTER — Ambulatory Visit
Admission: RE | Admit: 2016-06-07 | Discharge: 2016-06-07 | Disposition: A | Discharge status: Home / Self Care | Payer: 59 | Source: Ambulatory Visit | Attending: Obstetrics and Gynecology | Admitting: Obstetrics and Gynecology

## 2016-06-07 DIAGNOSIS — Z1231 Encounter for screening mammogram for malignant neoplasm of breast: Secondary | ICD-10-CM

## 2016-10-24 ENCOUNTER — Ambulatory Visit (HOSPITAL_COMMUNITY): Admission: EM | Admit: 2016-10-24 | Discharge: 2016-10-24 | Discharge status: Against Medical Advice | Payer: 59

## 2016-12-23 ENCOUNTER — Other Ambulatory Visit: Payer: Self-pay | Admitting: Internal Medicine

## 2017-03-22 ENCOUNTER — Other Ambulatory Visit: Payer: Self-pay

## 2017-03-22 MED ORDER — METFORMIN HCL ER 500 MG PO TB24: ORAL_TABLET | ORAL | 3 refills | Status: DC

## 2017-05-12 ENCOUNTER — Ambulatory Visit (INDEPENDENT_AMBULATORY_CARE_PROVIDER_SITE_OTHER): Payer: 59 | Admitting: Internal Medicine

## 2017-05-12 ENCOUNTER — Encounter: Payer: Self-pay | Admitting: Internal Medicine

## 2017-05-12 VITALS — BP 120/78 | HR 86 | Ht 60.5 in | Wt 251.2 lb

## 2017-05-12 DIAGNOSIS — E1165 Type 2 diabetes mellitus with hyperglycemia: Secondary | ICD-10-CM

## 2017-05-12 LAB — POCT GLYCOSYLATED HEMOGLOBIN (HGB A1C): Hemoglobin A1C: 10.2

## 2017-05-12 MED ORDER — DULAGLUTIDE 0.75 MG/0.5ML ~~LOC~~ SOAJ: SUBCUTANEOUS | 1 refills | Status: DC

## 2017-05-12 MED ORDER — GLIPIZIDE 5 MG PO TABS: ORAL_TABLET | ORAL | 3 refills | Status: DC

## 2017-05-12 NOTE — Patient Instructions (Addendum)
Please continue: - Metformin ER 1000 mg 2x a day with meals  Please increase: - Glipizide to 5 mg 2x a day before meals  Please start Trulicity 8.56 mg weekly. Let me know when you are close to running out to call in the higher dose to your pharmacy (1.5 mg).  Check sugars 1x a day, rotating check times.  Please return in 1.5 months with your sugar log

## 2017-05-12 NOTE — Addendum Note (Signed)
Addended by: Drucilla Schmidt on: 05/12/2017 10:49 AM   Modules accepted: Orders

## 2017-05-12 NOTE — Progress Notes (Signed)
Patient ID: Colleen Henry, female   DOB: 07/13/1960, 56 y.o.   MRN: [id number]  HPI: Colleen Henry is a 56 y.o.-year-old female, returning for f/u for DM2, dx in ~2011, non-insulin-dependent, uncontrolled, without complications. Last visit 1 year and 4 months ago!  Last hemoglobin A1c was: Lab Results  Component Value Date   HGBA1C 9.8 11/27/2015   HGBA1C 9.8 08/26/2015   HGBA1C 8.5 04/07/2015  11/2014: HbA1c 9.9% 09/17/2014: HbA1c 12.2% 04/11/2014: HbA1c 8.6%  Pt was on a regimen of: - Metformin XR 1000 mg bid - cannot remember what happened with reg metformin - Farxiga 10 mg daily in am - started 07/2015 >> switched to Invokana but ran out  - Biocell: Biotin, Zinc, Chromium She stopped Amaryl - was taking 2 mg before b'fast Insulin was suggested to her >> too expensive 300$/mo  She is now on: - Metformin XR 1000 mg 2x a day (did not change to 2000 mg with dinner daily). Ran out of Invokana 100 mg daily in am before b'fast right after our last visit. - She restarted Glipizide 5 mg but at bedtime in last 6 mo.  Pt checks her sugars once a day: - am: 150-230 >> 205-265 >> 150-160 >> 150s, 160s-180s, 200s - 2h after b'fast: n/c - before lunch: n/c - 2h after lunch: 150s >> n/c >> 170 >> n/c - before dinner: n/c - 2h after dinner: 170-180 >> 150-180 >> n/c - bedtime: n/c - nighttime: n/c Lowest sugar was 150 >> 164; unclear if she has hypoglycemia awareness  Highest sugar was 190 >> 200s.  Glucometer: UltraTrak  -No CKD, last BUN/creatinine:  Lab Results  Component Value Date   BUN 13 04/18/2016   CREATININE 0.43 (L) 04/18/2016  07/31/2015: 14/0.41, eGFR 162 01/29/2015: ACR 31.6. On lisinopril.  -+ HL; last set of lipids: No results found for: CHOL, HDL, LDLCALC, LDLDIRECT, TRIG, CHOLHDL 07/31/2015: 190/75/54/120 09/17/2014: 183/109/47/114 04/11/2014: 189/87/48/124  She is not on a statin.  - last eye exam was on 05/19/2016: No DR  -Denies numbness and  tingling in her feet.  ROS: Constitutional: no weight gain/no weight loss, no fatigue, no subjective hyperthermia, no subjective hypothermia, + nocturia Eyes: no blurry vision, no xerophthalmia ENT: no sore throat, no nodules palpated in throat, no dysphagia, no odynophagia, no hoarseness Cardiovascular: no CP/no SOB/no palpitations/no leg swelling Respiratory: no cough/no SOB/no wheezing Gastrointestinal: no N/no V/no D/no C/no acid reflux Musculoskeletal: no muscle aches/no joint aches Skin: no rashes, + hair loss Neurological: no tremors/no numbness/no tingling/no dizziness  I reviewed pt's medications, allergies, PMH, social hx, family hx, and changes were documented in the history of present illness. Otherwise, unchanged from my initial visit note.  Past Medical History:  Diagnosis Date  . Complication of anesthesia    agitation upon waking up as a child .   Marland Kitchen Depression   . Diabetes mellitus    oral meds for diabetes . Type 2  . Hypertension   . Snores    Past Surgical History:  Procedure Laterality Date  . CESAREAN SECTION  01/16/1998  . HERNIA REPAIR  2013  . I&D EXTREMITY    . INSERTION OF MESH  04/20/2012   Procedure: INSERTION OF MESH;  Surgeon: Adin Hector, MD;  Location: [place];  Service: General;  Laterality: N/A;  . MASS EXCISION Right 04/19/2016   Procedure: RIGHT SMALL FINGER EXCISION MASS;  Surgeon: Daryll Brod, MD;  Location: [place];  Service: Orthopedics;  Laterality: Right;  .  TONSILLECTOMY AND ADENOIDECTOMY  age 12  . VENTRAL HERNIA REPAIR  04/20/2012   Procedure: LAPAROSCOPIC VENTRAL HERNIA;  Surgeon: Adin Hector, MD;  Location: [place];  Service: General;  Laterality: N/A;  laparoscopic ventral wall hernia repair  . VENTRAL HERNIA REPAIR  04/20/2012   Procedure: HERNIA REPAIR VENTRAL ADULT;  Surgeon: Adin Hector, MD;  Location: [place];  Service: General;  Laterality: N/A;  Repair incarcerated ventral hernia times two.   History    Social History  . Marital Status: Married    Spouse Name: N/A  . Number of Children: 1   Occupational History  .  office administrator   Social History Main Topics  . Smoking status: Never Smoker   . Smokeless tobacco: Never Used  . Alcohol Use: Yes     Comment: occasionally - socially -   Wine or vodka once or twice a month , 2-3 drinks  . Drug Use: No   Current Outpatient Medications on File Prior to Visit  Medication Sig Dispense Refill  . glipiZIDE (GLUCOTROL) 5 MG tablet TAKE 1 TABLET BY MOUTH DAILY BEFORE SUPPER 90 tablet 1  . HYDROcodone-acetaminophen (NORCO) 5-325 MG tablet Take 1 tablet by mouth every 6 (six) hours as needed. 20 tablet 0  . INVOKANA 100 MG TABS tablet TAKE 1 TABLET BY MOUTH DAILY 30 tablet 2  . lisinopril-hydrochlorothiazide (PRINZIDE,ZESTORETIC) 20-25 MG tablet Take 1 tablet by mouth daily.    . medroxyPROGESTERone (PROVERA) 5 MG tablet Take 5 mg by mouth as needed.     . metFORMIN (GLUCOPHAGE-XR) 500 MG 24 hr tablet TAKE 2 TABLETS BY MOUTH TWICE DAILY WITHA MEAL 360 tablet 3  . sertraline (ZOLOFT) 50 MG tablet Take 50 mg by mouth daily.     No current facility-administered medications on file prior to visit.    Allergies  Allergen Reactions  . Penicillins Hives    Tolerates keflex   The patient has a family history of Family History  Problem Relation Age of Onset  . Hypertension Mother   . Hyperlipidemia Mother   . Diabetes Maternal Grandmother   . Heart disease Maternal Grandfather   . Hyperlipidemia Maternal Grandfather    PE: BP 120/78   Pulse 86   Ht 5' 0.5" (1.537 m)   Wt 251 lb 3.2 oz (113.9 kg)   SpO2 96%   BMI 48.25 kg/m  Body mass index is 48.25 kg/m. Wt Readings from Last 3 Encounters:  05/12/17 251 lb 3.2 oz (113.9 kg)  04/19/16 242 lb (109.8 kg)  01/12/16 242 lb (109.8 kg)   Constitutional: Obese, in NAD Eyes: PERRLA, EOMI, no exophthalmos ENT: moist mucous membranes, no thyromegaly, no cervical  lymphadenopathy Cardiovascular: RRR, No MRG Respiratory: CTA B Gastrointestinal: abdomen soft, NT, ND, BS+ Musculoskeletal: no deformities, strength intact in all 4 Skin: moist, warm, no rashes Neurological: no tremor with outstretched hands, DTR normal in all 4  ASSESSMENT: 1. DM2, non-insulin-dependent, uncontrolled, without long-term complications, but with hyperglycemia  2. Obesity class 3  PLAN:  1. Patient with long-standing, uncontrolled diabetes, on oral antidiabetic regimen, returning after long absence.  At last visit, sugars were better.  We moved the entire metformin dose at dinnertime at last visit to improve her a.m. Sugars.  However, she did not remember to do this.  She continues at 1000 mg twice a day.  Also, since last visit, she stopped Invokana.  She recently started glipizide but at bedtime, rather than before meals.  She is still checking  sugars once a day but did not start to rotate sites, as advised.  Overall, she was completely noncompliant with the recommended plan and visit frequency.  However, at this visit, she has decided to gain control of her diabetes. - We discussed about continuing metformin as she is taking now, but add a GLP-1 receptor agonist and also glipizide before meals.  She refused to add Invokana instead of glipizide.  I am not sure if we can manage her diabetes without insulin, but we can try for the next 1.5 months. - Discussed about the mechanism of action for Trulicity and possible side effects.  Given discount card. - I suggested to:  Patient Instructions  Please continue: - Metformin ER 1000 mg 2x a day with meals  Please increase: - Glipizide to 5 mg 2x a day before meals  Please start Trulicity 8.30 mg weekly. Let me know when you are close to running out to call in the higher dose to your pharmacy (1.5 mg).  Check sugars 1x a day, rotating check times.  Please return in 1.5 months with your sugar log  - today, HbA1c is 10.2%  (higher) - continue checking sugars at different times of the day - check 1x a day, rotating checks - advised for yearly eye exams >> she is UTD - Return to clinic in 1.5 mo with sugar log   2. Obesity class 3 - Worse - She gained 9 pounds in last year - We will start Trulicity, which should help  Philemon Kingdom, MD PhD [facility]

## 2017-06-19 ENCOUNTER — Other Ambulatory Visit: Payer: Self-pay | Admitting: Physician Assistant

## 2017-06-19 DIAGNOSIS — Z1231 Encounter for screening mammogram for malignant neoplasm of breast: Secondary | ICD-10-CM

## 2017-07-07 ENCOUNTER — Ambulatory Visit
Admission: RE | Admit: 2017-07-07 | Discharge: 2017-07-07 | Disposition: A | Discharge status: Home / Self Care | Payer: Managed Care, Other (non HMO) | Source: Ambulatory Visit | Attending: Physician Assistant | Admitting: Physician Assistant

## 2017-07-07 DIAGNOSIS — Z1231 Encounter for screening mammogram for malignant neoplasm of breast: Secondary | ICD-10-CM

## 2017-07-18 ENCOUNTER — Ambulatory Visit: Payer: 59 | Admitting: Internal Medicine

## 2017-11-24 ENCOUNTER — Ambulatory Visit (INDEPENDENT_AMBULATORY_CARE_PROVIDER_SITE_OTHER): Payer: Managed Care, Other (non HMO) | Admitting: Internal Medicine

## 2017-11-24 ENCOUNTER — Encounter: Payer: Self-pay | Admitting: Internal Medicine

## 2017-11-24 VITALS — BP 152/92 | HR 72 | Ht 60.5 in | Wt 242.8 lb

## 2017-11-24 DIAGNOSIS — E1165 Type 2 diabetes mellitus with hyperglycemia: Secondary | ICD-10-CM

## 2017-11-24 LAB — POCT GLYCOSYLATED HEMOGLOBIN (HGB A1C): HEMOGLOBIN A1C: 9 % — AB (ref 4.0–5.6)

## 2017-11-24 NOTE — Patient Instructions (Addendum)
Please continue: - Glipizide 5 mg 2x a day before meals  Please move: - Metformin ER 2000 mg with dinner.  Please return in 3 months with your sugar log

## 2017-11-24 NOTE — Addendum Note (Signed)
Addended by: Drucilla Schmidt on: 11/24/2017 02:07 PM   Modules accepted: Orders

## 2017-11-24 NOTE — Progress Notes (Addendum)
Patient ID: Colleen Henry, female   DOB: 11-07-1960, 57 y.o.   MRN: [id number]  HPI: Colleen Henry is a 57 y.o.-year-old female, returning for f/u for DM2, dx in ~2011, non-insulin-dependent, uncontrolled, without complications. Last visit 6 months ago.  She is not usually compliant with appointments and recommended regimen.  She started to walk 2 mi at the track most days of the week.  Last hemoglobin A1c was: Lab Results  Component Value Date   HGBA1C 10.2 05/12/2017   HGBA1C 9.8 11/27/2015   HGBA1C 9.8 08/26/2015  11/2014: HbA1c 9.9% 09/17/2014: HbA1c 12.2% 04/11/2014: HbA1c 8.6%  Pt was on a regimen of: - Metformin XR 1000 mg bid - cannot remember what happened with reg metformin - Farxiga 10 mg daily in am - started 07/2015 >> switched to Invokana but ran out  - Biocell: Biotin, Zinc, Chromium She stopped Amaryl - was taking 2 mg before b'fast Insulin was suggested to her >> too expensive 300$/mo  She is now on: - Metformin ER 1000 mg 2x a day with meals - Glipizide 5 mg 2x a day before meals - takes 1st dose before lunch (first meal of the day) She did not start Trulicity as suggested at last visit.   Pt checks her sugars once a day: - am:  150-160 >> 150s, 160s-180s, 200s >> 150-170 - 2h after b'fast: n/c - before lunch: n/c - 2h after lunch: 150s >> n/c >> 170 >> n/c - before dinner: n/c - 2h after dinner: 170-180 >> 150-180 >> n/c - bedtime: n/c >> 97, 140s - nighttime: n/c Lowest sugar was 150 >> 164 >> 97;  it is unclear at which level she has hypoglycemia awareness. Highest sugar was 190 >> 200s >> 170.  Glucometer: UltraTrak  - no CKD but does have microalbuminuria per review of the records from PCP, last BUN/creatinine:  10/06/2017: Glucose 126, BUN/creatinine 11/0.43, GFR 153, ACR 71.6  Lab Results  Component Value Date   BUN 13 04/18/2016   CREATININE 0.43 (L) 04/18/2016  02/17/2016: ACR 66.8 07/31/2015: 14/0.41, eGFR 162 01/29/2015: ACR 31.6. On  lisinopril.  -+ HL; last set of lipids: 10/06/2017: 203/112/49/132 04/13/2017: 213/104/54/139 No results found for: CHOL, HDL, LDLCALC, LDLDIRECT, TRIG, CHOLHDL 07/31/2015: 190/75/54/120 09/17/2014: 183/109/47/114 04/11/2014: 189/87/48/124  She is not on a statin.  - last eye exam was on  04/2016: No DR   -No numbness and tingling in her feet.  ROS: Constitutional: + weight loss, no fatigue, no subjective hyperthermia, no subjective hypothermia, + nocturia Eyes: no blurry vision, no xerophthalmia ENT: no sore throat, no nodules palpated in throat, no dysphagia, no odynophagia, no hoarseness Cardiovascular: no CP/no SOB/no palpitations/no leg swelling Respiratory: no cough/no SOB/no wheezing Gastrointestinal: no N/no V/no D/no C/no acid reflux Musculoskeletal: no muscle aches/+ joint aches (knee) Skin: no rashes, no hair loss Neurological: no tremors/no numbness/no tingling/no dizziness  I reviewed pt's medications, allergies, PMH, social hx, family hx, and changes were documented in the history of present illness. Otherwise, unchanged from my initial visit note.  Past Medical History:  Diagnosis Date  . Complication of anesthesia    agitation upon waking up as a child .   Marland Kitchen Depression   . Diabetes mellitus    oral meds for diabetes . Type 2  . Hypertension   . Snores    Past Surgical History:  Procedure Laterality Date  . BREAST BIOPSY    . CESAREAN SECTION  01/16/1998  . HERNIA REPAIR  2013  .  I&D EXTREMITY    . INSERTION OF MESH  04/20/2012   Procedure: INSERTION OF MESH;  Surgeon: Adin Hector, MD;  Location: [place];  Service: General;  Laterality: N/A;  . MASS EXCISION Right 04/19/2016   Procedure: RIGHT SMALL FINGER EXCISION MASS;  Surgeon: Daryll Brod, MD;  Location: [place];  Service: Orthopedics;  Laterality: Right;  . TONSILLECTOMY AND ADENOIDECTOMY  age 57  . VENTRAL HERNIA REPAIR  04/20/2012   Procedure: LAPAROSCOPIC VENTRAL HERNIA;   Surgeon: Adin Hector, MD;  Location: [place];  Service: General;  Laterality: N/A;  laparoscopic ventral wall hernia repair  . VENTRAL HERNIA REPAIR  04/20/2012   Procedure: HERNIA REPAIR VENTRAL ADULT;  Surgeon: Adin Hector, MD;  Location: [place];  Service: General;  Laterality: N/A;  Repair incarcerated ventral hernia times two.   History   Social History  . Marital Status: Married    Spouse Name: N/A  . Number of Children: 1   Occupational History  .  office administrator   Social History Main Topics  . Smoking status: Never Smoker   . Smokeless tobacco: Never Used  . Alcohol Use: Yes     Comment: occasionally - socially -   Wine or vodka once or twice a month , 2-3 drinks  . Drug Use: No   Current Outpatient Medications on File Prior to Visit  Medication Sig Dispense Refill  . Dulaglutide (TRULICITY) 5.80 DX/8.3JA SOPN Inject 0.75 mg weekly under skin 4 pen 1  . glipiZIDE (GLUCOTROL) 5 MG tablet TAKE 1 TABLET BY MOUTH 2x DAILY BEFORE meals 180 tablet 3  . lisinopril-hydrochlorothiazide (PRINZIDE,ZESTORETIC) 20-25 MG tablet Take 1 tablet by mouth daily.    . medroxyPROGESTERone (PROVERA) 5 MG tablet Take 5 mg by mouth as needed.     . metFORMIN (GLUCOPHAGE-XR) 500 MG 24 hr tablet TAKE 2 TABLETS BY MOUTH TWICE DAILY WITHA MEAL 360 tablet 3  . sertraline (ZOLOFT) 50 MG tablet Take 50 mg by mouth daily.     No current facility-administered medications on file prior to visit.    Allergies  Allergen Reactions  . Penicillins Hives    Tolerates keflex   The patient has a family history of Family History  Problem Relation Age of Onset  . Hypertension Mother   . Hyperlipidemia Mother   . Diabetes Maternal Grandmother   . Heart disease Maternal Grandfather   . Hyperlipidemia Maternal Grandfather    PE: BP (!) 152/92   Pulse 72   Ht 5' 0.5" (1.537 m)   Wt 242 lb 12.8 oz (110.1 kg)   SpO2 98%   BMI 46.64 kg/m  Body mass index is 46.64 kg/m. Wt Readings from Last 3  Encounters:  11/24/17 242 lb 12.8 oz (110.1 kg)  05/12/17 251 lb 3.2 oz (113.9 kg)  04/19/16 242 lb (109.8 kg)   Constitutional: overweight, in NAD Eyes: PERRLA, EOMI, no exophthalmos ENT: moist mucous membranes, no thyromegaly, no cervical lymphadenopathy Cardiovascular: RRR, No MRG Respiratory: CTA B Gastrointestinal: abdomen soft, NT, ND, BS+ Musculoskeletal: no deformities, strength intact in all 4 Skin: moist, warm, no rashes Neurological: no tremor with outstretched hands, DTR normal in all 4  ASSESSMENT: 1. DM2, non-insulin-dependent, uncontrolled, without long-term complications, but with hyperglycemia  2. Obesity class 3  PLAN:  1. Patient with long-standing, uncontrolled, type 2 diabetes, on oral antidiabetic regimen, and now also GLP-1 receptor agonist added at last visit, after which she was returning after long absence.  Her  sugars were higher and her HbA1c was high, at 10.2% and gained approximately 9 pounds in the year prior to our last visit. She also stopped Invokana before last visit.  Overall, she is not compliant with visits and diabetes regimen.  - at this visit, sugars are still high, but a little better, after she started walking  I again offered a GLP1 R agonist >> refuses, she would like to work on life style changes for now on the same regimen - to improve am sugars >> will try to move all Metformin at dinnertime - I suggested to:  Patient Instructions  Please continue: - Glipizide 5 mg 2x a day before meals  Please move: - Metformin ER 2000 mg with dinner.  Please return in 3 months with your sugar log   - today, HbA1c is 9% (better, but still high) - continue checking sugars at different times of the day - check 1x a day, rotating checks - advised for yearly eye exams >> she is UTD - Return to clinic in 3 mo with sugar log   2. Obesity class 3 - continue walking - lost 9 lbs since last visit! - refuses Trulicity   Philemon Kingdom, MD  PhD [facility]

## 2018-02-05 ENCOUNTER — Ambulatory Visit (HOSPITAL_COMMUNITY)
Admission: EM | Admit: 2018-02-05 | Discharge: 2018-02-05 | Disposition: A | Discharge status: Home / Self Care | Payer: 59 | Attending: Family Medicine | Admitting: Family Medicine

## 2018-02-05 ENCOUNTER — Ambulatory Visit (INDEPENDENT_AMBULATORY_CARE_PROVIDER_SITE_OTHER): Payer: 59

## 2018-02-05 ENCOUNTER — Other Ambulatory Visit: Payer: Self-pay

## 2018-02-05 ENCOUNTER — Encounter (HOSPITAL_COMMUNITY): Payer: Self-pay | Admitting: Emergency Medicine

## 2018-02-05 DIAGNOSIS — R05 Cough: Secondary | ICD-10-CM

## 2018-02-05 DIAGNOSIS — J189 Pneumonia, unspecified organism: Secondary | ICD-10-CM

## 2018-02-05 DIAGNOSIS — J181 Lobar pneumonia, unspecified organism: Secondary | ICD-10-CM

## 2018-02-05 MED ORDER — BENZONATATE 100 MG PO CAPS: 100.0000 mg | ORAL_CAPSULE | Freq: Three times a day (TID) | ORAL | 0 refills | Status: DC

## 2018-02-05 MED ORDER — AZITHROMYCIN 250 MG PO TABS: ORAL_TABLET | ORAL | 0 refills | Status: AC

## 2018-02-05 NOTE — Discharge Instructions (Addendum)
Push fluids to ensure adequate hydration and keep secretions thin.  Tylenol and/or ibuprofen as needed for pain or fevers.  Complete course of antibiotics.  Tessalon as needed to help with cough.  Please follow up with your primary care provider in the next 1-2 weeks for recheck as may need repeat chest xray in the next month.  If develop worsening of fevers, shortness of breath , difficulty breathing or otherwise worsening please return or go to the Er.

## 2018-02-05 NOTE — ED Provider Notes (Addendum)
[place]    CSN: [id number] Arrival date & time: 02/05/18  1715     History   Chief Complaint Chief Complaint  Patient presents with  . Cough    HPI Colleen Henry is a 57 y.o. female.   Colleen Henry presents with complaints of body aches, congestion, sore throat, productive cough, which started two days ago. No ear pain. Temps have been low grade- 99-100. Has been taking tylenol and ibuprofen. Took ibuprofen last this morning and tylenol this afternoon. No chest pain , no shortness of breath . Son with URI last week and coworker ill as well. No history of asthma, doesn't smoke. No gi/gu complaints. Has also taken emergen-C which has not helped with symptoms. Hx fo depression, dm, htn.     ROS per HPI.      Past Medical History:  Diagnosis Date  . Complication of anesthesia    agitation upon waking up as a child .   Marland Kitchen Depression   . Diabetes mellitus    oral meds for diabetes . Type 2  . Hypertension   . Snores     Patient Active Problem List   Diagnosis Date Noted  . Type 2 diabetes mellitus with hyperglycemia, without long-term current use of insulin ([place]) 08/26/2015  . Ventral hernia x2 s/p lap [place] repair w mesh 04/20/2012 01/17/2012  . HTN (hypertension) 01/17/2012  . Depression 01/17/2012  . Obesity, Class III, BMI 40-49.9 (morbid obesity) ([place]) 01/17/2012    Past Surgical History:  Procedure Laterality Date  . BREAST BIOPSY    . CESAREAN SECTION  01/16/1998  . HERNIA REPAIR  2013  . I&D EXTREMITY    . INSERTION OF MESH  04/20/2012   Procedure: INSERTION OF MESH;  Surgeon: Adin Hector, MD;  Location: [place];  Service: General;  Laterality: N/A;  . MASS EXCISION Right 04/19/2016   Procedure: RIGHT SMALL FINGER EXCISION MASS;  Surgeon: Daryll Brod, MD;  Location: [facility];  Service: Orthopedics;  Laterality: Right;  . TONSILLECTOMY AND ADENOIDECTOMY  age 58  . VENTRAL HERNIA REPAIR  04/20/2012   Procedure: LAPAROSCOPIC VENTRAL  HERNIA;  Surgeon: Adin Hector, MD;  Location: [facility];  Service: General;  Laterality: N/A;  laparoscopic ventral wall hernia repair  . VENTRAL HERNIA REPAIR  04/20/2012   Procedure: HERNIA REPAIR VENTRAL ADULT;  Surgeon: Adin Hector, MD;  Location: [place] [facility];  Service: General;  Laterality: N/A;  Repair incarcerated ventral hernia times two.    OB History   None      Home Medications    Prior to Admission medications   Medication Sig Start Date End Date Taking? Authorizing Provider  azithromycin (ZITHROMAX) 250 MG tablet Take 2 tablets (500 mg total) by mouth daily for 1 day, THEN 1 tablet (250 mg total) daily for 4 days. 02/05/18 02/10/18  Zigmund Gottron, NP  benzonatate (TESSALON) 100 MG capsule Take 1 capsule (100 mg total) by mouth every 8 (eight) hours. 02/05/18   Zigmund Gottron, NP  glipiZIDE (GLUCOTROL) 5 MG tablet TAKE 1 TABLET BY MOUTH 2x DAILY BEFORE meals 05/12/17   Philemon Kingdom, MD  lisinopril-hydrochlorothiazide (PRINZIDE,ZESTORETIC) 20-25 MG tablet Take 1 tablet by mouth daily.    [provider]  sertraline (ZOLOFT) 50 MG tablet Take 50 mg by mouth daily.    [provider]    Family History Family History  Problem Relation Age of Onset  . Hypertension Mother   . Hyperlipidemia Mother   .  Diabetes Maternal Grandmother   . Heart disease Maternal Grandfather   . Hyperlipidemia Maternal Grandfather     Social History Social History   Tobacco Use  . Smoking status: Never Smoker  . Smokeless tobacco: Never Used  Substance Use Topics  . Alcohol use: Yes    Comment: occasionally - socially  . Drug use: No     Allergies   Penicillins   Review of Systems Review of Systems   Physical Exam Triage Vital Signs ED Triage Vitals  Enc Vitals Group     BP 02/05/18 1818 (!) 156/72     Pulse Rate 02/05/18 1818 83     Resp 02/05/18 1818 (!) 22     Temp 02/05/18 1818 99.2 F (37.3 C)     Temp Source 02/05/18 1818 Oral     SpO2 02/05/18  1818 96 %     Weight --      Height --      Head Circumference --      Peak Flow --      Pain Score 02/05/18 1816 2     Pain Loc --      Pain Edu? --      Excl. in [place]? --    No data found.  Updated Vital Signs BP (!) 156/72 (BP Location: Left Arm) Comment (BP Location): large cuff  Pulse 83   Temp 99.2 F (37.3 C) (Oral)   Resp (!) 22   SpO2 96%    Physical Exam  Constitutional: She is oriented to person, place, and time. She appears well-developed and well-nourished. No distress.  HENT:  Head: Normocephalic and atraumatic.  Right Ear: Tympanic membrane, external ear and ear canal normal.  Left Ear: Tympanic membrane, external ear and ear canal normal.  Nose: Rhinorrhea present. Right sinus exhibits no maxillary sinus tenderness and no frontal sinus tenderness. Left sinus exhibits no maxillary sinus tenderness and no frontal sinus tenderness.  Mouth/Throat: Uvula is midline, oropharynx is clear and moist and mucous membranes are normal. No tonsillar exudate.  Eyes: Pupils are equal, round, and reactive to light. Conjunctivae and EOM are normal.  Cardiovascular: Normal rate, regular rhythm and normal heart sounds.  Pulmonary/Chest: Effort normal and breath sounds normal.  Hoarse voice noted; no cough during exam   Neurological: She is alert and oriented to person, place, and time.  Skin: Skin is warm and dry.     UC Treatments / Results  Labs (all labs ordered are listed, but only abnormal results are displayed) Labs Reviewed - No data to display  EKG None  Radiology Dg Chest 2 View  Result Date: 02/05/2018 CLINICAL DATA:  57 year old female with cough and low-grade fever with sore throat and body aches for the past 3 days. EXAM: CHEST - 2 VIEW COMPARISON:  Chest x-ray 04/16/2012. FINDINGS: Ill-defined areas of consolidation are noted in the left lower lobe, likely in the superior segment of the left lower lobe. Right lung is clear. No pleural effusions. No evidence of  pulmonary edema. No pneumothorax. Heart size is normal. Upper mediastinal contours are within normal limits. IMPRESSION: 1. Findings are concerning for bronchopneumonia centered in the superior segment of the left lower lobe. Followup PA and lateral chest X-ray is recommended in 3-4 weeks following trial of antibiotic therapy to ensure resolution and exclude underlying malignancy. Electronically Signed   By: Vinnie Langton M.D.   On: 02/05/2018 19:16    Procedures Procedures (including critical care time)  Medications Ordered in UC Medications -  No data to display  Initial Impression / Assessment and Plan / UC Course  I have reviewed the triage vital signs and the nursing notes.  Pertinent labs & imaging results that were available during my care of the patient were reviewed by me and considered in my medical decision making (see chart for details).     Febrile, productive cough. Chest xray concerning for pna. Course of azithromycin provided. Encouraged close follow up with PCP Return precautions provided. Patient verbalized understanding and agreeable to plan.   Final Clinical Impressions(s) / UC Diagnoses   Final diagnoses:  Community acquired pneumonia of left lower lobe of lung ([place])     Discharge Instructions     Push fluids to ensure adequate hydration and keep secretions thin.  Tylenol and/or ibuprofen as needed for pain or fevers.  Complete course of antibiotics.  Tessalon as needed to help with cough.  Please follow up with your primary care provider in the next 1-2 weeks for recheck as may need repeat chest xray in the next month.  If develop worsening of fevers, shortness of breath , difficulty breathing or otherwise worsening please return or go to the Er.     ED Prescriptions    Medication Sig Dispense Auth. Provider   azithromycin (ZITHROMAX) 250 MG tablet Take 2 tablets (500 mg total) by mouth daily for 1 day, THEN 1 tablet (250 mg total) daily for 4 days. 6  tablet Augusto Gamble B, NP   benzonatate (TESSALON) 100 MG capsule Take 1 capsule (100 mg total) by mouth every 8 (eight) hours. 21 capsule Zigmund Gottron, NP     Controlled Substance Prescriptions [place] Controlled Substance Registry consulted? Not Applicable   Zigmund Gottron, NP 02/05/18 Doran Heater    Zigmund Gottron, NP 02/05/18 1924

## 2018-02-05 NOTE — ED Triage Notes (Signed)
Cough and fever started Saturday.  Patient took temp in lobby 100.3.  Patient has sinus drainage

## 2018-02-23 ENCOUNTER — Ambulatory Visit
Admission: RE | Admit: 2018-02-23 | Discharge: 2018-02-23 | Disposition: A | Discharge status: Home / Self Care | Payer: 59 | Source: Ambulatory Visit | Attending: Physician Assistant | Admitting: Physician Assistant

## 2018-02-23 ENCOUNTER — Other Ambulatory Visit: Payer: Self-pay | Admitting: Physician Assistant

## 2018-02-23 DIAGNOSIS — Z09 Encounter for follow-up examination after completed treatment for conditions other than malignant neoplasm: Secondary | ICD-10-CM

## 2018-02-23 DIAGNOSIS — J189 Pneumonia, unspecified organism: Secondary | ICD-10-CM | POA: Diagnosis not present

## 2018-02-23 DIAGNOSIS — R05 Cough: Secondary | ICD-10-CM | POA: Diagnosis not present

## 2018-03-13 ENCOUNTER — Ambulatory Visit: Payer: Managed Care, Other (non HMO) | Admitting: Internal Medicine

## 2018-03-31 ENCOUNTER — Other Ambulatory Visit: Payer: Self-pay | Admitting: Internal Medicine

## 2018-04-10 DIAGNOSIS — E7800 Pure hypercholesterolemia, unspecified: Secondary | ICD-10-CM | POA: Diagnosis not present

## 2018-04-10 DIAGNOSIS — I10 Essential (primary) hypertension: Secondary | ICD-10-CM | POA: Diagnosis not present

## 2018-05-29 ENCOUNTER — Other Ambulatory Visit: Payer: Self-pay | Admitting: Physician Assistant

## 2018-05-29 DIAGNOSIS — Z1231 Encounter for screening mammogram for malignant neoplasm of breast: Secondary | ICD-10-CM

## 2018-06-11 ENCOUNTER — Other Ambulatory Visit: Payer: Self-pay | Admitting: Internal Medicine

## 2018-06-19 ENCOUNTER — Ambulatory Visit: Payer: 59 | Admitting: Internal Medicine

## 2018-06-26 DIAGNOSIS — L570 Actinic keratosis: Secondary | ICD-10-CM | POA: Diagnosis not present

## 2018-06-26 DIAGNOSIS — L282 Other prurigo: Secondary | ICD-10-CM | POA: Diagnosis not present

## 2018-07-10 ENCOUNTER — Ambulatory Visit
Admission: RE | Admit: 2018-07-10 | Discharge: 2018-07-10 | Disposition: A | Discharge status: Home / Self Care | Payer: 59 | Source: Ambulatory Visit | Attending: Physician Assistant | Admitting: Physician Assistant

## 2018-07-10 DIAGNOSIS — Z1231 Encounter for screening mammogram for malignant neoplasm of breast: Secondary | ICD-10-CM | POA: Diagnosis not present

## 2018-08-09 DIAGNOSIS — H5201 Hypermetropia, right eye: Secondary | ICD-10-CM | POA: Diagnosis not present

## 2018-08-14 ENCOUNTER — Other Ambulatory Visit: Payer: Self-pay

## 2018-08-14 ENCOUNTER — Encounter (INDEPENDENT_AMBULATORY_CARE_PROVIDER_SITE_OTHER): Payer: 59 | Admitting: Ophthalmology

## 2018-08-14 DIAGNOSIS — H43813 Vitreous degeneration, bilateral: Secondary | ICD-10-CM | POA: Diagnosis not present

## 2018-08-14 DIAGNOSIS — I10 Essential (primary) hypertension: Secondary | ICD-10-CM | POA: Diagnosis not present

## 2018-08-14 DIAGNOSIS — H35033 Hypertensive retinopathy, bilateral: Secondary | ICD-10-CM

## 2018-08-14 DIAGNOSIS — H34831 Tributary (branch) retinal vein occlusion, right eye, with macular edema: Secondary | ICD-10-CM

## 2018-08-30 DIAGNOSIS — I10 Essential (primary) hypertension: Secondary | ICD-10-CM | POA: Diagnosis not present

## 2018-09-11 ENCOUNTER — Encounter (INDEPENDENT_AMBULATORY_CARE_PROVIDER_SITE_OTHER): Payer: 59 | Admitting: Ophthalmology

## 2018-09-11 ENCOUNTER — Other Ambulatory Visit: Payer: Self-pay

## 2018-09-11 DIAGNOSIS — H2513 Age-related nuclear cataract, bilateral: Secondary | ICD-10-CM

## 2018-09-11 DIAGNOSIS — H35033 Hypertensive retinopathy, bilateral: Secondary | ICD-10-CM | POA: Diagnosis not present

## 2018-09-11 DIAGNOSIS — I10 Essential (primary) hypertension: Secondary | ICD-10-CM

## 2018-09-11 DIAGNOSIS — H34831 Tributary (branch) retinal vein occlusion, right eye, with macular edema: Secondary | ICD-10-CM | POA: Diagnosis not present

## 2018-09-11 DIAGNOSIS — H43813 Vitreous degeneration, bilateral: Secondary | ICD-10-CM | POA: Diagnosis not present

## 2018-10-09 ENCOUNTER — Encounter (INDEPENDENT_AMBULATORY_CARE_PROVIDER_SITE_OTHER): Payer: 59 | Admitting: Ophthalmology

## 2018-10-09 ENCOUNTER — Other Ambulatory Visit: Payer: Self-pay

## 2018-10-09 DIAGNOSIS — H35033 Hypertensive retinopathy, bilateral: Secondary | ICD-10-CM

## 2018-10-09 DIAGNOSIS — H43813 Vitreous degeneration, bilateral: Secondary | ICD-10-CM | POA: Diagnosis not present

## 2018-10-09 DIAGNOSIS — H34831 Tributary (branch) retinal vein occlusion, right eye, with macular edema: Secondary | ICD-10-CM

## 2018-10-09 DIAGNOSIS — H2513 Age-related nuclear cataract, bilateral: Secondary | ICD-10-CM

## 2018-10-09 DIAGNOSIS — I10 Essential (primary) hypertension: Secondary | ICD-10-CM

## 2018-11-06 ENCOUNTER — Other Ambulatory Visit: Payer: Self-pay

## 2018-11-06 ENCOUNTER — Encounter (INDEPENDENT_AMBULATORY_CARE_PROVIDER_SITE_OTHER): Payer: 59 | Admitting: Ophthalmology

## 2018-11-06 DIAGNOSIS — I10 Essential (primary) hypertension: Secondary | ICD-10-CM | POA: Diagnosis not present

## 2018-11-06 DIAGNOSIS — H34831 Tributary (branch) retinal vein occlusion, right eye, with macular edema: Secondary | ICD-10-CM | POA: Diagnosis not present

## 2018-11-06 DIAGNOSIS — H43813 Vitreous degeneration, bilateral: Secondary | ICD-10-CM | POA: Diagnosis not present

## 2018-11-06 DIAGNOSIS — H35033 Hypertensive retinopathy, bilateral: Secondary | ICD-10-CM | POA: Diagnosis not present

## 2018-12-11 ENCOUNTER — Encounter (INDEPENDENT_AMBULATORY_CARE_PROVIDER_SITE_OTHER): Payer: 59 | Admitting: Ophthalmology

## 2019-01-02 ENCOUNTER — Encounter (INDEPENDENT_AMBULATORY_CARE_PROVIDER_SITE_OTHER): Payer: 59 | Admitting: Ophthalmology

## 2019-01-23 ENCOUNTER — Encounter (INDEPENDENT_AMBULATORY_CARE_PROVIDER_SITE_OTHER): Payer: 59 | Admitting: Ophthalmology

## 2019-02-27 ENCOUNTER — Other Ambulatory Visit: Payer: Self-pay

## 2019-02-28 ENCOUNTER — Encounter: Payer: Self-pay | Admitting: Internal Medicine

## 2019-02-28 ENCOUNTER — Ambulatory Visit (INDEPENDENT_AMBULATORY_CARE_PROVIDER_SITE_OTHER): Payer: 59 | Admitting: Internal Medicine

## 2019-02-28 VITALS — BP 142/90 | HR 75 | Ht 60.25 in | Wt 241.0 lb

## 2019-02-28 DIAGNOSIS — E785 Hyperlipidemia, unspecified: Secondary | ICD-10-CM | POA: Diagnosis not present

## 2019-02-28 DIAGNOSIS — E1165 Type 2 diabetes mellitus with hyperglycemia: Secondary | ICD-10-CM

## 2019-02-28 LAB — POCT GLYCOSYLATED HEMOGLOBIN (HGB A1C): Hemoglobin A1C: 8 % — AB (ref 4.0–5.6)

## 2019-02-28 NOTE — Patient Instructions (Addendum)
Please change: - Metformin ER 2000 with dinner - Glipizide 5 mg 30 min before b'fast and dinner  Please stop at the lab.  Please return in 4 months with your sugar log.

## 2019-02-28 NOTE — Progress Notes (Signed)
Patient ID: Colleen Henry, female   DOB: Sep 29, 1960, 58 y.o.   MRN: [id number]  HPI: Colleen Henry is a 58 y.o.-year-old female, returning for f/u for DM2, dx in ~2011, non-insulin-dependent, uncontrolled, without complications. Last visit 3 months ago.  She restarted walking and also started to dance once a week. She also adjusted her diet and eats less carbs now.  Last hemoglobin A1c was: Lab Results  Component Value Date   HGBA1C 9.0 (A) 11/24/2017   HGBA1C 10.2 05/12/2017   HGBA1C 9.8 11/27/2015  11/2014: HbA1c 9.9% 09/17/2014: HbA1c 12.2% 04/11/2014: HbA1c 8.6%  Pt was on a regimen of: - Metformin XR 1000 mg bid - cannot remember what happened with reg metformin - Farxiga 10 mg daily in am - started 07/2015 >> switched to Invokana but ran out  - Biocell: Biotin, Zinc, Chromium She stopped Amaryl - was taking 2 mg before b'fast Insulin was suggested to her >> too expensive 300$/mo  She is now on: - Glipizide 5 mg 2x a day, however, the second dose is at bedtime - Metformin ER 1000 mg 2x a day (did not move the entire dose at dinnertime as advised) Refused Trulicity at last visit.  Pt checks her sugars 1x a day - 14 day ave per meter review: 172: - am: 150s, 160s-180s, 200s >> 150-170 >> 150-178 - 2h after b'fast: n/c - before lunch: n/c - 2h after lunch: 150s >> n/c >> 170 >> n/c >> 114 - before dinner: n/c - 2h after dinner: 170-180 >> 150-180 >> n/c >> 136-168 - bedtime: n/c >> 97, 140s >> 232 - nighttime: n/c >> 114 Lowest sugar was 150 >> 164 >> 97 >> 114; unclear at which level she has hypogly awareness Highest sugar was 190 >> 200s >> 170 >> 232.  Glucometer: UltraTrak  - no CKDbut does have microalbuminuria per review of the records from PCP, last BUN/creatinine:  10/06/2017: Glucose 126, BUN/creatinine 11/0.43, GFR 153, ACR 71.6 Lab Results  Component Value Date   BUN 13 04/18/2016   CREATININE 0.43 (L) 04/18/2016  02/17/2016: ACR 66.8 07/31/2015: 14/0.41,  eGFR 162 01/29/2015: ACR 31.6. On Lisinopril.  -+ HL; last set of lipids: 10/06/2017: 203/112/49/132 04/13/2017: 213/104/54/139 No results found for: CHOL, HDL, LDLCALC, LDLDIRECT, TRIG, CHOLHDL 07/31/2015: 190/75/54/120 09/17/2014: 183/109/47/114 04/11/2014: 189/87/48/124  Not on a statin.  - last Henry exam was on  07/2018: + DR , was getting IO injections.  -no numbness and tingling in her feet.  ROS: Constitutional: no weight gain/no weight loss, no fatigue, no subjective hyperthermia, no subjective hypothermia Eyes: no blurry vision, no xerophthalmia ENT: no sore throat, no nodules palpated in neck, no dysphagia, no odynophagia, no hoarseness Cardiovascular: no CP/no SOB/no palpitations/no leg swelling Respiratory: no cough/no SOB/no wheezing Gastrointestinal: no N/no V/no D/no C/no acid reflux Musculoskeletal: no muscle aches/+ joint aches Skin: no rashes, no hair loss Neurological: no tremors/no numbness/no tingling/no dizziness  I reviewed pt's medications, allergies, PMH, social hx, family hx, and changes were documented in the history of present illness. Otherwise, unchanged from my initial visit note.  Past Medical History:  Diagnosis Date  . Complication of anesthesia    agitation upon waking up as a child .   Marland Kitchen Depression   . Diabetes mellitus    oral meds for diabetes . Type 2  . Hypertension   . Snores    Past Surgical History:  Procedure Laterality Date  . BREAST BIOPSY    . CESAREAN SECTION  01/16/1998  .  HERNIA REPAIR  2013  . I&D EXTREMITY    . INSERTION OF MESH  04/20/2012   Procedure: INSERTION OF MESH;  Surgeon: Adin Hector, MD;  Location: [facility];  Service: General;  Laterality: N/A;  . MASS EXCISION Right 04/19/2016   Procedure: RIGHT SMALL FINGER EXCISION MASS;  Surgeon: Daryll Brod, MD;  Location: [facility];  Service: Orthopedics;  Laterality: Right;  . TONSILLECTOMY AND ADENOIDECTOMY  age 58  . VENTRAL HERNIA REPAIR  04/20/2012    Procedure: LAPAROSCOPIC VENTRAL HERNIA;  Surgeon: Adin Hector, MD;  Location: [place];  Service: General;  Laterality: N/A;  laparoscopic ventral wall hernia repair  . VENTRAL HERNIA REPAIR  04/20/2012   Procedure: HERNIA REPAIR VENTRAL ADULT;  Surgeon: Adin Hector, MD;  Location: [facility];  Service: General;  Laterality: N/A;  Repair incarcerated ventral hernia times two.   History   Social History  . Marital Status: Married    Spouse Name: N/A  . Number of Children: 1   Occupational History  .  office administrator   Social History Main Topics  . Smoking status: Never Smoker   . Smokeless tobacco: Never Used  . Alcohol Use: Yes     Comment: occasionally - socially -   Wine or vodka once or twice a month , 2-3 drinks  . Drug Use: No   Current Outpatient Medications on File Prior to Visit  Medication Sig Dispense Refill  . glipiZIDE (GLUCOTROL) 5 MG tablet TAKE 1 TABLET BY MOUTH TWICE DAILY BEFORE MEALS 180 tablet 3  . lisinopril-hydrochlorothiazide (PRINZIDE,ZESTORETIC) 20-25 MG tablet Take 1 tablet by mouth daily.    . metFORMIN (GLUCOPHAGE-XR) 500 MG 24 hr tablet TAKE 2 TABLETS BY MOUTH TWICE DAILY WITHA MEAL 360 tablet 3  . sertraline (ZOLOFT) 50 MG tablet Take 50 mg by mouth daily.     No current facility-administered medications on file prior to visit.    Allergies  Allergen Reactions  . Penicillins Hives    Tolerates keflex   The patient has a family history of Family History  Problem Relation Age of Onset  . Hypertension Mother   . Hyperlipidemia Mother   . Diabetes Maternal Grandmother   . Heart disease Maternal Grandfather   . Hyperlipidemia Maternal Grandfather    PE: BP (!) 142/90   Pulse 75   Ht 5' 0.25" (1.53 m) Comment: measured today without shoes  Wt 241 lb (109.3 kg)   SpO2 97%   BMI 46.68 kg/m  Body mass index is 46.68 kg/m. Wt Readings from Last 3 Encounters:  02/28/19 241 lb (109.3 kg)  11/24/17 242 lb 12.8 oz (110.1 kg)  05/12/17 251  lb 3.2 oz (113.9 kg)   Constitutional: overweight, in NAD Eyes: PERRLA, EOMI, no exophthalmos ENT: moist mucous membranes, no thyromegaly, no cervical lymphadenopathy Cardiovascular: RRR, No MRG Respiratory: CTA B Gastrointestinal: abdomen soft, NT, ND, BS+ Musculoskeletal: no deformities, strength intact in all 4 Skin: moist, warm, no rashes Neurological: no tremor with outstretched hands, DTR normal in all 4  ASSESSMENT: 1. DM2, non-insulin-dependent, uncontrolled, without long-term complications, but with hyperglycemia  2. Obesity class 3  3. HL  PLAN:  1. Patient with longstanding, uncontrolled, type 2 diabetes, on a simple oral antidiabetic regimen as she refused complex regimen in the past.  She returns after another long absence, of 1 year and 3 months.  Sugars are slightly better per review of her meter download but still above target.  We checked her  HbA1c: 8% (better) -Reviewing her medication regimen, she forgot to move the metformin at dinnertime as advised at last visit.  We will move the entire dose, 2000 mg with dinner to hopefully improve blood sugars in the morning.  Of note, she has no GI discomfort from metformin. -Also, she is not taking glipizide correctly, with the second dose taken at bedtime, and not 30 minutes before dinner.  I advised her to move both doses before meals. -Since her HbA1c is better, we can follow her on the above regimen for now - I suggested to:  Patient Instructions  Please change: - Metformin ER 2000 with dinner - Glipizide 5 mg 30 min before b'fast and dinner  Please stop at the lab.  Please return in 4 months with your sugar log.   - advised to check sugars at different times of the day - 1x a day, rotating check times - advised for yearly Henry exams >> she is UTD - we we will check annual labs today - return to clinic in 3-4 months   2. Obesity class 3 - no wt loss since last OV - refused Trulicity in the past - Continue  exercise  3. HL - Reviewed latest lipid panel from 09/2017: LDL above target  -She is not on a statin -We will check a lipid panel today  Orders Placed This Encounter  Procedures  . Microalbumin / creatinine urine ratio  . COMPLETE METABOLIC PANEL WITH GFR  . Lipid panel  . POCT glycosylated hemoglobin (Hb A1C)   Component     Latest Ref Rng & Units 02/28/2019  Glucose     65 - 99 mg/dL 133 (H)  BUN     7 - 25 mg/dL 12  Creatinine     0.50 - 1.05 mg/dL 0.47 (L)  GFR, Est Non African American     > OR = 60 mL/min/1.23m 110  GFR, Est African American     > OR = 60 mL/min/1.728m128  BUN/Creatinine Ratio     6 - 22 (calc) 26 (H)  Sodium     135 - 146 mmol/L 140  Potassium     3.5 - 5.3 mmol/L 4.3  Chloride     98 - 110 mmol/L 99  CO2     20 - 32 mmol/L 32  Calcium     8.6 - 10.4 mg/dL 9.8  Total Protein     6.1 - 8.1 g/dL 7.3  Albumin MSPROF     3.6 - 5.1 g/dL 4.3  Globulin     1.9 - 3.7 g/dL (calc) 3.0  AG Ratio     1.0 - 2.5 (calc) 1.4  Total Bilirubin     0.2 - 1.2 mg/dL 0.6  Alkaline phosphatase (APISO)     37 - 153 U/L 87  AST     10 - 35 U/L 11  ALT     6 - 29 U/L 16  Cholesterol     0 - 200 mg/dL 203 (H)  Triglycerides     0.0 - 149.0 mg/dL 105.0  HDL Cholesterol     >39.00 mg/dL 52.00  VLDL     0.0 - 40.0 mg/dL 21.0  LDL (calc)     0 - 99 mg/dL 130 (H)  Total CHOL/HDL Ratio      4  NonHDL      150.51  Microalb, Ur     0.0 - 1.9 mg/dL 3.6 (H)  Creatinine,U     mg/dL  79.7  MICROALB/CREAT RATIO     0.0 - 30.0 mg/g 4.5   LDL still above target, ACR normal now, glucose slightly high. Will suggest a statin.  Philemon Kingdom, MD PhD [facility]

## 2019-03-01 LAB — COMPLETE METABOLIC PANEL WITH GFR
AG Ratio: 1.4 (calc) (ref 1.0–2.5)
ALT: 16 U/L (ref 6–29)
AST: 11 U/L (ref 10–35)
Albumin: 4.3 g/dL (ref 3.6–5.1)
Alkaline phosphatase (APISO): 87 U/L (ref 37–153)
BUN/Creatinine Ratio: 26 (calc) — ABNORMAL HIGH (ref 6–22)
BUN: 12 mg/dL (ref 7–25)
CO2: 32 mmol/L (ref 20–32)
Calcium: 9.8 mg/dL (ref 8.6–10.4)
Chloride: 99 mmol/L (ref 98–110)
Creat: 0.47 mg/dL — ABNORMAL LOW (ref 0.50–1.05)
GFR, Est African American: 128 mL/min/{1.73_m2} (ref >=60)
GFR, Est Non African American: 110 mL/min/{1.73_m2} (ref >=60)
Globulin: 3 g/dL (calc) (ref 1.9–3.7)
Glucose, Bld: 133 mg/dL — ABNORMAL HIGH (ref 65–99)
Potassium: 4.3 mmol/L (ref 3.5–5.3)
Sodium: 140 mmol/L (ref 135–146)
Total Bilirubin: 0.6 mg/dL (ref 0.2–1.2)
Total Protein: 7.3 g/dL (ref 6.1–8.1)

## 2019-03-01 LAB — LIPID PANEL
Cholesterol: 203 mg/dL — ABNORMAL HIGH (ref 0–200)
HDL: 52 mg/dL (ref >39.00)
LDL Cholesterol: 130 mg/dL — ABNORMAL HIGH (ref 0–99)
NonHDL: 150.51
Total CHOL/HDL Ratio: 4
Triglycerides: 105 mg/dL (ref 0.0–149.0)
VLDL: 21 mg/dL (ref 0.0–40.0)

## 2019-03-01 LAB — MICROALBUMIN / CREATININE URINE RATIO
Creatinine,U: 79.7 mg/dL
Microalb Creat Ratio: 4.5 mg/g (ref 0.0–30.0)
Microalb, Ur: 3.6 mg/dL — ABNORMAL HIGH (ref 0.0–1.9)

## 2019-03-06 ENCOUNTER — Telehealth: Payer: Self-pay

## 2019-03-06 NOTE — Telephone Encounter (Signed)
-----   Message from Philemon Kingdom, MD sent at 03/01/2019  4:46 PM EDT ----- Lenna Sciara, can you please call pt:  Glucose level is only slightly high, at 133.  Kidney function is normal.  Liver tests are normal.Her lipid panel is similar to before, with the LDL (bad cholesterol) still above target, at 130.  Which he agree to start a statin?  If so, I would suggest rosuvastatin 5 mg daily - #90 with 3 refills.

## 2019-03-06 NOTE — Telephone Encounter (Signed)
Left message for patient to return our call at [phone].  

## 2019-03-11 NOTE — Telephone Encounter (Signed)
Notified patient of message from Dr. Cruzita Lederer, patient expressed understanding and agreement. No further questions.  Patient does not want to start at this time.

## 2019-04-26 ENCOUNTER — Other Ambulatory Visit: Payer: Self-pay | Admitting: Internal Medicine

## 2019-06-18 ENCOUNTER — Other Ambulatory Visit: Payer: Self-pay | Admitting: Internal Medicine

## 2019-06-19 ENCOUNTER — Other Ambulatory Visit: Payer: Self-pay | Admitting: Physician Assistant

## 2019-06-19 DIAGNOSIS — Z1231 Encounter for screening mammogram for malignant neoplasm of breast: Secondary | ICD-10-CM

## 2019-07-04 ENCOUNTER — Ambulatory Visit: Payer: 59 | Admitting: Internal Medicine

## 2019-07-25 ENCOUNTER — Ambulatory Visit
Admission: RE | Admit: 2019-07-25 | Discharge: 2019-07-25 | Disposition: A | Discharge status: Home / Self Care | Payer: 59 | Source: Ambulatory Visit | Attending: Physician Assistant | Admitting: Physician Assistant

## 2019-07-25 ENCOUNTER — Other Ambulatory Visit: Payer: Self-pay

## 2019-07-25 DIAGNOSIS — Z1231 Encounter for screening mammogram for malignant neoplasm of breast: Secondary | ICD-10-CM

## 2019-08-19 ENCOUNTER — Encounter: Payer: Self-pay | Admitting: Internal Medicine

## 2019-08-27 ENCOUNTER — Ambulatory Visit: Payer: 59 | Admitting: Internal Medicine

## 2019-11-08 ENCOUNTER — Ambulatory Visit: Payer: 59 | Admitting: Internal Medicine

## 2019-11-08 ENCOUNTER — Encounter: Payer: Self-pay | Admitting: Internal Medicine

## 2019-11-08 ENCOUNTER — Other Ambulatory Visit: Payer: Self-pay

## 2019-11-08 VITALS — BP 130/80 | HR 60 | Ht 60.25 in | Wt 234.0 lb

## 2019-11-08 DIAGNOSIS — Z794 Long term (current) use of insulin: Secondary | ICD-10-CM

## 2019-11-08 DIAGNOSIS — E1165 Type 2 diabetes mellitus with hyperglycemia: Secondary | ICD-10-CM

## 2019-11-08 DIAGNOSIS — E785 Hyperlipidemia, unspecified: Secondary | ICD-10-CM | POA: Diagnosis not present

## 2019-11-08 LAB — POCT GLYCOSYLATED HEMOGLOBIN (HGB A1C): Hemoglobin A1C: 9.2 % — AB (ref 4.0–5.6)

## 2019-11-08 MED ORDER — INSULIN PEN NEEDLE 32G X 4 MM MISC: 3 refills | Status: DC

## 2019-11-08 MED ORDER — TRESIBA FLEXTOUCH 200 UNIT/ML ~~LOC~~ SOPN: 14.0000 [IU] | PEN_INJECTOR | Freq: Every day | SUBCUTANEOUS | 3 refills | Status: DC

## 2019-11-08 NOTE — Patient Instructions (Addendum)
Please continue: - Metformin ER 1000 mg 2x a day - Glipizide 5 mg 30 min before b'fast and dinner  Please start: - Tresiba 14 units daily. Increase by 4 units every 4 days until sugars in am are <130, or until you reach 30 units.  When injecting insulin: Inject in the abdomen Rotate the injection sites around the belly button Change needle for each injection Keep needle in for 10 sec after last unit of insulin in Keep the insulin in use out of the fridge  Please return in 1.5 months with your sugar log.

## 2019-11-08 NOTE — Addendum Note (Signed)
Addended by: Cardell Peach I on: 11/08/2019 01:54 PM   Modules accepted: Orders

## 2019-11-08 NOTE — Progress Notes (Addendum)
Patient ID: Colleen Henry, female   DOB: [birth year], 59 y.o.   MRN: [id number]  This visit occurred during the SARS-CoV-2 public health emergency.  Safety protocols were in place, including screening questions prior to the visit, additional usage of staff PPE, and extensive cleaning of exam room while observing appropriate contact time as indicated for disinfecting solutions.   HPI: Colleen Henry is a 59 y.o.-year-old female, returning for f/u for DM2, dx in ~2011, previously non-insulin-dependent, uncontrolled, without complications. Last visit 8 months ago.  Before last visit she was walking and also started to dance once a week.  She also adjusted her diet to eat less carbs.  At this visit, she is still walking, blood sugars are much higher.  Reviewed HbA1c levels Lab Results  Component Value Date   HGBA1C 8.0 (A) 02/28/2019   HGBA1C 9.0 (A) 11/24/2017   HGBA1C 10.2 05/12/2017  11/2014: HbA1c 9.9% 09/17/2014: HbA1c 12.2% 04/11/2014: HbA1c 8.6%  Pt was on a regimen of: - Metformin XR 1000 mg bid - cannot remember what happened with reg metformin - Farxiga 10 mg daily in am - started 07/2015 >> switched to Invokana but ran out  - Biocell: Biotin, Zinc, Chromium She stopped Amaryl - was taking 2 mg before b'fast Insulin was suggested to her >> too expensive 300$/mo  She is on: - Glipizide 5 mg 2x a day now before meals - Metformin ER 1000 mg 2x a day  (she was afraid to try this due to possible GI symptoms) Refused Trulicity.  Pt checks her sugars 0-1 a day: - am: 150s, 160s-180s, 200s >> 150-170 >> 150-178 >> 131-175 in 09/2019, 195-236 now - 2h after b'fast: n/c - before lunch: n/c - 2h after lunch: 150s >> n/c >> 170 >> n/c >> 114 >> n/c - before dinner: n/c - 2h after dinner: 170-180 >> 150-180 >> n/c >> 136-168 >> 205, 255, 297, 303 - bedtime: n/c >> 97, 140s >> 232 - nighttime: n/c >> 114 Lowest sugar was 150 >> 164 >> 97 >> 114 >> 131; it is unclear at which level she  has hypoglycemia awareness. Highest sugar was 190 >> 200s >> 170 >> 232 >>303.  Glucometer: UltraTrak  -No CKD but she does have microalbuminuria, last BUN/creatinine:  Lab Results  Component Value Date   BUN 12 02/28/2019   CREATININE 0.47 (L) 02/28/2019   Lab Results  Component Value Date   MICRALBCREAT 4.5 02/28/2019   10/06/2017: Glucose 126, BUN/creatinine 11/0.43, GFR 153, ACR 71.6 02/17/2016: ACR 66.8 07/31/2015: 14/0.41, eGFR 162 01/29/2015: ACR 31.6. On lisinopril.  -+ HL; last set of lipids: Lab Results  Component Value Date   CHOL 203 (H) 02/28/2019   HDL 52.00 02/28/2019   LDLCALC 130 (H) 02/28/2019   TRIG 105.0 02/28/2019   CHOLHDL 4 02/28/2019  10/06/2017: 203/112/49/132 04/13/2017: 213/104/54/139 07/31/2015: 190/75/54/120 09/17/2014: 183/109/47/114 04/11/2014: 189/87/48/124  She is not on a statin.  I suggested a statin at last visit, but she did not want to start this.  - last eye exam was on 07/2018: + DR, was getting IO injections.  - + numbness and tingling in her feet.  ROS: Constitutional: no weight gain/+ weight loss, no fatigue, no subjective hyperthermia, no subjective hypothermia Eyes: no blurry vision, no xerophthalmia ENT: no sore throat, no nodules palpated in neck, no dysphagia, no odynophagia, no hoarseness Cardiovascular: no CP/no SOB/no palpitations/no leg swelling Respiratory: no cough/no SOB/no wheezing Gastrointestinal: no N/no V/no D/no C/no acid reflux Musculoskeletal: no  muscle aches/+ joint aches Skin: no rashes, no hair loss Neurological: no tremors/no numbness/no tingling/no dizziness  I reviewed pt's medications, allergies, PMH, social hx, family hx, and changes were documented in the history of present illness. Otherwise, unchanged from my initial visit note.  Past Medical History:  Diagnosis Date  . Complication of anesthesia    agitation upon waking up as a child .   Marland Kitchen Depression   . Diabetes mellitus    oral meds  for diabetes . Type 2  . Hypertension   . Snores    Past Surgical History:  Procedure Laterality Date  . BREAST BIOPSY    . CESAREAN SECTION  01/16/1998  . HERNIA REPAIR  2013  . I & D EXTREMITY    . INSERTION OF MESH  04/20/2012   Procedure: INSERTION OF MESH;  Surgeon: Adin Hector, MD;  Location: [place];  Service: General;  Laterality: N/A;  . MASS EXCISION Right 04/19/2016   Procedure: RIGHT SMALL FINGER EXCISION MASS;  Surgeon: Daryll Brod, MD;  Location: [place];  Service: Orthopedics;  Laterality: Right;  . TONSILLECTOMY AND ADENOIDECTOMY  age 39  . VENTRAL HERNIA REPAIR  04/20/2012   Procedure: LAPAROSCOPIC VENTRAL HERNIA;  Surgeon: Adin Hector, MD;  Location: [place];  Service: General;  Laterality: N/A;  laparoscopic ventral wall hernia repair  . VENTRAL HERNIA REPAIR  04/20/2012   Procedure: HERNIA REPAIR VENTRAL ADULT;  Surgeon: Adin Hector, MD;  Location: [place];  Service: General;  Laterality: N/A;  Repair incarcerated ventral hernia times two.   History   Social History  . Marital Status: Married    Spouse Name: N/A  . Number of Children: 1   Occupational History  .  office administrator   Social History Main Topics  . Smoking status: Never Smoker   . Smokeless tobacco: Never Used  . Alcohol Use: Yes     Comment: occasionally - socially -   Wine or vodka once or twice a month , 2-3 drinks  . Drug Use: No   Current Outpatient Medications on File Prior to Visit  Medication Sig Dispense Refill  . glipiZIDE (GLUCOTROL) 5 MG tablet TAKE 1 TABLET BY MOUTH TWICE DAILY WITH MEALS 180 tablet 3  . lisinopril-hydrochlorothiazide (PRINZIDE,ZESTORETIC) 20-25 MG tablet Take 1 tablet by mouth daily.    . metFORMIN (GLUCOPHAGE-XR) 500 MG 24 hr tablet TAKE 2 TABLETS BY MOUTH TWICE DAILY WITHA MEAL 360 tablet 3  . sertraline (ZOLOFT) 50 MG tablet Take 50 mg by mouth daily.     No current facility-administered medications on file prior to visit.    Allergies  Allergen Reactions  . Penicillins Hives    Tolerates keflex   The patient has a family history of Family History  Problem Relation Age of Onset  . Hypertension Mother   . Hyperlipidemia Mother   . Diabetes Maternal Grandmother   . Heart disease Maternal Grandfather   . Hyperlipidemia Maternal Grandfather    PE: BP 130/80   Pulse 60   Ht 5' 0.25" (1.53 m)   Wt 234 lb (106.1 kg)   SpO2 98%   BMI 45.32 kg/m  Body mass index is 45.32 kg/m. Wt Readings from Last 3 Encounters:  11/08/19 234 lb (106.1 kg)  02/28/19 241 lb (109.3 kg)  11/24/17 242 lb 12.8 oz (110.1 kg)   Constitutional: overweight, in NAD Eyes: PERRLA, EOMI, no exophthalmos ENT: moist mucous membranes, no thyromegaly, no cervical lymphadenopathy Cardiovascular: RRR, No  MRG Respiratory: CTA B Gastrointestinal: abdomen soft, NT, ND, BS+ Musculoskeletal: no deformities, strength intact in all 4 Skin: moist, warm, no rashes Neurological: no tremor with outstretched hands, DTR normal in all 4  ASSESSMENT: 1. DM2,  Now insulin-dependent, uncontrolled, without long-term complications, but with hyperglycemia  2. Obesity class 3  3. HL  PLAN:  1. Patient with longstanding, uncontrolled, type 2 diabetes, on a simple medication regimen as she refused addition of newer medications at last visit.  She returns after another long absence of 8 months, previously returned after 1 year and 3 months.  At last visit HbA1c was better, at 8%.  At that time, she forgot to move the Metformin at dinnertime and I again advised her to do so.  Also, she was not taking glipizide correctly, with a second dose taken at bedtime, not before dinner.  We moved glipizide doses 30 min before meals. -At this visit, she tells me that she did not move Metformin at dinnertime as she was afraid of side effects.  She continues 1000 mg twice a day.  She is also trying her best to move glipizide before meals but some that she remembers this  only after meals.  However, at this visit, sugars are much higher, mostly in the 200s and 300s especially in the last month.  We discussed that as of now, she would need insulin.  She agrees to start long-acting insulin.  We discussed about benefits and possible side effects of insulin (weight gain).  We also discussed about correct injection techniques and I demonstrated pen use.  We will start at a lower dose and increase as needed. - I suggested to:  Patient Instructions  Please continue: - Metformin ER 1000 mg 2x a day - Glipizide 5 mg 30 min before b'fast and dinner  Please start: - Tresiba 14 units daily. Increase by 4 units every 4 days until sugars in am are <130, or until you reach 30 units.  When injecting insulin: Inject in the abdomen Rotate the injection sites around the belly button Change needle for each injection Keep needle in for 10 sec after last unit of insulin in Keep the insulin in use out of the fridge  Please return in 1.5 months with your sugar log.   - we checked her HbA1c: 9.2% (higher) - advised to check sugars at different times of the day - 1x a day, rotating check times-given sugar log and advised her when to check - advised for yearly eye exams >> she is UTD - return to clinic in 4 months   2. Obesity class 3 -She did not lose weight before last visit - since then, she lost approximately 7 pounds -She refused a GLP-1 receptor agonist in the past, but would agree with it now.  However, the sugars are too high for this now.  I am hoping that we can may be added at next visit  3. HL - Reviewed latest lipid panel from last OV: high LDL and total cholesterol: Lab Results  Component Value Date   CHOL 203 (H) 02/28/2019   HDL 52.00 02/28/2019   LDLCALC 130 (H) 02/28/2019   TRIG 105.0 02/28/2019   CHOLHDL 4 02/28/2019  -At last visit, suggested a statin, but she refused   Philemon Kingdom, MD PhD [facility] Endocrinology

## 2019-11-11 MED ORDER — TOUJEO MAX SOLOSTAR 300 UNIT/ML ~~LOC~~ SOPN: 14.0000 [IU] | PEN_INJECTOR | Freq: Every day | SUBCUTANEOUS | 2 refills | Status: DC

## 2019-12-20 ENCOUNTER — Encounter: Payer: Self-pay | Admitting: Internal Medicine

## 2019-12-20 ENCOUNTER — Ambulatory Visit: Payer: 59 | Admitting: Internal Medicine

## 2019-12-20 ENCOUNTER — Other Ambulatory Visit: Payer: Self-pay

## 2019-12-20 VITALS — BP 130/88 | HR 67 | Ht 60.25 in | Wt 239.0 lb

## 2019-12-20 DIAGNOSIS — E785 Hyperlipidemia, unspecified: Secondary | ICD-10-CM

## 2019-12-20 DIAGNOSIS — E1165 Type 2 diabetes mellitus with hyperglycemia: Secondary | ICD-10-CM | POA: Diagnosis not present

## 2019-12-20 DIAGNOSIS — Z794 Long term (current) use of insulin: Secondary | ICD-10-CM | POA: Diagnosis not present

## 2019-12-20 MED ORDER — TOUJEO MAX SOLOSTAR 300 UNIT/ML ~~LOC~~ SOPN: 18.0000 [IU] | PEN_INJECTOR | Freq: Every day | SUBCUTANEOUS | 2 refills | Status: DC

## 2019-12-20 NOTE — Progress Notes (Signed)
Patient ID: Colleen Henry, female   DOB: Jun 08, 1960, 59 y.o.   MRN: [id number]  This visit occurred during the SARS-CoV-2 public health emergency.  Safety protocols were in place, including screening questions prior to the visit, additional usage of staff PPE, and extensive cleaning of exam room while observing appropriate contact time as indicated for disinfecting solutions.   HPI: Colleen Henry is a 59 y.o.-year-old female, returning for f/u for DM2, dx in ~2011, insulin-dependent since 10/2019, uncontrolled, without complications. Last visit 1 month ago.  Reviewed HbA1c levels: Lab Results  Component Value Date   HGBA1C 9.2 (A) 11/08/2019   HGBA1C 8.0 (A) 02/28/2019   HGBA1C 9.0 (A) 11/24/2017  11/2014: HbA1c 9.9% 09/17/2014: HbA1c 12.2% 04/11/2014: HbA1c 8.6%  Pt was on a regimen of: - Metformin XR 1000 mg bid - cannot remember what happened with reg metformin - Farxiga 10 mg daily in am - started 07/2015 >> switched to Invokana but ran out  - Biocell: Biotin, Zinc, Chromium She stopped Amaryl - was taking 2 mg before b'fast Insulin was suggested to her >> too expensive 300$/mo  She is on: - Glipizide 5 mg 2x a day now before meals - Metformin ER 1000 mg 2x a day - Tresiba >> Toujeo 14 units daily  - added 10/2019  - did not increase the dose as advised... Refused Trulicity in the past but she was open to it 10/2019.  Pt checks her sugars 0-1x a day: - am: 150-178 >> 131-175 in 09/2019, 195-236 >> 144-168 - 2h after b'fast: n/c - before lunch: n/c - 2h after lunch: 170 >> n/c >> 114 >> n/c  - before dinner: n/c >> 96 - 2h after dinner: 136-168 >> 205, 255, 297, 303 >> 134, 158 - bedtime: n/c >> 97, 140s >> 232 >> n/c - nighttime: n/c >> 114 >> n/c Lowest sugar was 131 >> 96; it is unclear at which CBG level she has hypoglycemia unawareness. Highest sugar was 303 >> 168.  Glucometer: UltraTrak  -No CKD but she does have microalbuminuria, last BUN/creatinine:  Lab  Results  Component Value Date   BUN 12 02/28/2019   CREATININE 0.47 (L) 02/28/2019   Lab Results  Component Value Date   MICRALBCREAT 4.5 02/28/2019   10/06/2017: Glucose 126, BUN/creatinine 11/0.43, GFR 153, ACR 71.6 02/17/2016: ACR 66.8 07/31/2015: 14/0.41, eGFR 162 01/29/2015: ACR 31.6. On lisinopril.  -+ HL; last set of lipids: Lab Results  Component Value Date   CHOL 203 (H) 02/28/2019   HDL 52.00 02/28/2019   LDLCALC 130 (H) 02/28/2019   TRIG 105.0 02/28/2019   CHOLHDL 4 02/28/2019  10/06/2017: 203/112/49/132 04/13/2017: 213/104/54/139 07/31/2015: 190/75/54/120 09/17/2014: 183/109/47/114 04/11/2014: 189/87/48/124  She is not on a statin - refused to start.  - last eye exam was on 07/2018: + DR, was getting IO injections - not during the pandemic.  - + numbness and tingling in her feet.  ROS: Constitutional: no weight gain/no weight loss, no fatigue, no subjective hyperthermia, no subjective hypothermia Eyes: no blurry vision, no xerophthalmia ENT: no sore throat, no nodules palpated in neck, no dysphagia, no odynophagia, no hoarseness Cardiovascular: no CP/no SOB/no palpitations/no leg swelling Respiratory: no cough/no SOB/no wheezing Gastrointestinal: no N/no V/no D/no C/no acid reflux Musculoskeletal: no muscle aches/+ joint aches Skin: no rashes, no hair loss Neurological: no tremors/+ numbness/+ tingling/no dizziness  I reviewed pt's medications, allergies, PMH, social hx, family hx, and changes were documented in the history of present illness. Otherwise, unchanged from  my initial visit note.  Past Medical History:  Diagnosis Date  . Complication of anesthesia    agitation upon waking up as a child .   Marland Kitchen Depression   . Diabetes mellitus    oral meds for diabetes . Type 2  . Hypertension   . Snores    Past Surgical History:  Procedure Laterality Date  . BREAST BIOPSY    . CESAREAN SECTION  01/16/1998  . HERNIA REPAIR  2013  . I & D EXTREMITY    .  INSERTION OF MESH  04/20/2012   Procedure: INSERTION OF MESH;  Surgeon: Adin Hector, MD;  Location: [place];  Service: General;  Laterality: N/A;  . MASS EXCISION Right 04/19/2016   Procedure: RIGHT SMALL FINGER EXCISION MASS;  Surgeon: Daryll Brod, MD;  Location: [place];  Service: Orthopedics;  Laterality: Right;  . TONSILLECTOMY AND ADENOIDECTOMY  age 59  . VENTRAL HERNIA REPAIR  04/20/2012   Procedure: LAPAROSCOPIC VENTRAL HERNIA;  Surgeon: Adin Hector, MD;  Location: [place];  Service: General;  Laterality: N/A;  laparoscopic ventral wall hernia repair  . VENTRAL HERNIA REPAIR  04/20/2012   Procedure: HERNIA REPAIR VENTRAL ADULT;  Surgeon: Adin Hector, MD;  Location: [place];  Service: General;  Laterality: N/A;  Repair incarcerated ventral hernia times two.   History   Social History  . Marital Status: Married    Spouse Name: N/A  . Number of Children: 1   Occupational History  .  office administrator   Social History Main Topics  . Smoking status: Never Smoker   . Smokeless tobacco: Never Used  . Alcohol Use: Yes     Comment: occasionally - socially -   Wine or vodka once or twice a month , 2-3 drinks  . Drug Use: No   Current Outpatient Medications on File Prior to Visit  Medication Sig Dispense Refill  . glipiZIDE (GLUCOTROL) 5 MG tablet TAKE 1 TABLET BY MOUTH TWICE DAILY WITH MEALS 180 tablet 3  . insulin glargine, 2 Unit Dial, (TOUJEO MAX SOLOSTAR) 300 UNIT/ML Solostar Pen Inject 14 Units into the skin at bedtime. 3 pen 2  . Insulin Pen Needle 32G X 4 MM MISC Use 1x a day 100 each 3  . lisinopril-hydrochlorothiazide (PRINZIDE,ZESTORETIC) 20-25 MG tablet Take 1 tablet by mouth daily.    . metFORMIN (GLUCOPHAGE-XR) 500 MG 24 hr tablet TAKE 2 TABLETS BY MOUTH TWICE DAILY WITHA MEAL 360 tablet 3  . sertraline (ZOLOFT) 50 MG tablet Take 50 mg by mouth daily.     No current facility-administered medications on file prior to visit.   Allergies   Allergen Reactions  . Penicillins Hives    Tolerates keflex   The patient has a family history of Family History  Problem Relation Age of Onset  . Hypertension Mother   . Hyperlipidemia Mother   . Diabetes Maternal Grandmother   . Heart disease Maternal Grandfather   . Hyperlipidemia Maternal Grandfather    PE: BP (!) 130/88   Pulse 67   Ht 5' 0.25" (1.53 m)   Wt (!) 239 lb (108.4 kg)   SpO2 98%   BMI 46.29 kg/m  Body mass index is 46.29 kg/m. Wt Readings from Last 3 Encounters:  12/20/19 (!) 239 lb (108.4 kg)  11/08/19 234 lb (106.1 kg)  02/28/19 241 lb (109.3 kg)   Constitutional: overweight, in NAD Eyes: PERRLA, EOMI, no exophthalmos ENT: moist mucous membranes, no thyromegaly, no cervical lymphadenopathy  Cardiovascular: RRR, No MRG Respiratory: CTA B Gastrointestinal: abdomen soft, NT, ND, BS+ Musculoskeletal: no deformities, strength intact in all 4 Skin: moist, warm, no rashes Neurological: no tremor with outstretched hands, DTR normal in all 4  ASSESSMENT: 1. DM2,  Now insulin-dependent, uncontrolled, without long-term complications, but with hyperglycemia  2. Obesity class 3  3. HL  PLAN:  1. Patient with longstanding, uncontrolled, type 2 diabetes, on Metformin and sulfonylurea at last visit that she previously refused newer medications.  At last visit, she returns after another long absence of 8 months and her HbA1c was much higher.  Sugars were in the 200s and 300s and I explained that she was thyrotoxic and we could not manage her diabetes without insulin anymore.  She agreed to start long-acting insulin then.  I advised her how to do the injections correctly and how to use the insulin pen.  We started at a low dose and I advised her to increase the dose as needed.  We continued Metformin and glipizide. -At this visit, sugars have improved significantly, despite the fact that she forgot that she had to increase the dose of her insulin.  They are now only  slightly above goal in the morning and they are now at goal after dinner.  She does not have any sugar checks as she did not have strips that she just obtained them.  For now, we can continue her oral medications but will also try to increase Toujeo slightly to improve her morning sugars.  However, I expect the next HbA1c to be much better. -She also describes that she is not having any problems with the insulin injections - I suggested to:  Patient Instructions  Please continue: - Metformin ER 1000 mg 2x a day - Glipizide 5 mg 30 min before b'fast and dinner  Please increase: - Toujeo to 16-18 units daily  Please return in 3 months with your sugar log.   - advised to check sugars at different times of the day - 1-2x a day, rotating check times - advised for yearly eye exams >> she is UTD - return to clinic in 3 months  2. Obesity class 3 -She lost 7 pounds before last visit and gained 5 lbs since last OV -She refused a GLP-1 receptor agonist in the past but she was open to it at last visit.  However, at that time we had to add insulin as sugars were much worse.  At next visit, we can try to change glipizide to Trulicity.  3. HL -Reviewed latest lipid panel from 02/2019: LDL above target, the rest of the fractions at goal: Lab Results  Component Value Date   CHOL 203 (H) 02/28/2019   HDL 52.00 02/28/2019   LDLCALC 130 (H) 02/28/2019   TRIG 105.0 02/28/2019   CHOLHDL 4 02/28/2019  -She refused statins in the past  Philemon Kingdom, MD PhD [facility] Endocrinology

## 2019-12-20 NOTE — Patient Instructions (Signed)
Please continue: - Metformin ER 1000 mg 2x a day - Glipizide 5 mg 30 min before b'fast and dinner  Please increase: - Toujeo to 16-18 units daily  Please return in 3 months with your sugar log.

## 2020-01-02 ENCOUNTER — Encounter: Payer: Self-pay | Admitting: Internal Medicine

## 2020-01-13 ENCOUNTER — Encounter: Payer: Self-pay | Admitting: Internal Medicine

## 2020-01-13 NOTE — Progress Notes (Signed)
Received labs from 12/26/2019: Glucose 135, BUN/creatinine 13/0.46, GFR 140 Lipids: 216/88/62/139

## 2020-01-17 DIAGNOSIS — L570 Actinic keratosis: Secondary | ICD-10-CM | POA: Diagnosis not present

## 2020-01-17 DIAGNOSIS — D225 Melanocytic nevi of trunk: Secondary | ICD-10-CM | POA: Diagnosis not present

## 2020-01-17 DIAGNOSIS — L281 Prurigo nodularis: Secondary | ICD-10-CM | POA: Diagnosis not present

## 2020-01-17 DIAGNOSIS — L814 Other melanin hyperpigmentation: Secondary | ICD-10-CM | POA: Diagnosis not present

## 2020-01-17 DIAGNOSIS — L821 Other seborrheic keratosis: Secondary | ICD-10-CM | POA: Diagnosis not present

## 2020-03-11 DIAGNOSIS — M1712 Unilateral primary osteoarthritis, left knee: Secondary | ICD-10-CM | POA: Diagnosis not present

## 2020-03-18 DIAGNOSIS — Z01419 Encounter for gynecological examination (general) (routine) without abnormal findings: Secondary | ICD-10-CM | POA: Diagnosis not present

## 2020-03-18 DIAGNOSIS — N951 Menopausal and female climacteric states: Secondary | ICD-10-CM | POA: Diagnosis not present

## 2020-03-18 DIAGNOSIS — Z6841 Body Mass Index (BMI) 40.0 and over, adult: Secondary | ICD-10-CM | POA: Diagnosis not present

## 2020-03-31 ENCOUNTER — Encounter: Payer: Self-pay | Admitting: Internal Medicine

## 2020-03-31 ENCOUNTER — Ambulatory Visit: Payer: BC Managed Care – PPO | Admitting: Internal Medicine

## 2020-03-31 ENCOUNTER — Other Ambulatory Visit: Payer: Self-pay

## 2020-03-31 VITALS — BP 144/92 | HR 90 | Ht 60.0 in | Wt 242.0 lb

## 2020-03-31 DIAGNOSIS — E119 Type 2 diabetes mellitus without complications: Secondary | ICD-10-CM | POA: Diagnosis not present

## 2020-03-31 DIAGNOSIS — F419 Anxiety disorder, unspecified: Secondary | ICD-10-CM | POA: Diagnosis not present

## 2020-03-31 DIAGNOSIS — E785 Hyperlipidemia, unspecified: Secondary | ICD-10-CM

## 2020-03-31 DIAGNOSIS — E1165 Type 2 diabetes mellitus with hyperglycemia: Secondary | ICD-10-CM | POA: Diagnosis not present

## 2020-03-31 DIAGNOSIS — Z794 Long term (current) use of insulin: Secondary | ICD-10-CM

## 2020-03-31 DIAGNOSIS — E1169 Type 2 diabetes mellitus with other specified complication: Secondary | ICD-10-CM | POA: Diagnosis not present

## 2020-03-31 LAB — POCT GLYCOSYLATED HEMOGLOBIN (HGB A1C): Hemoglobin A1C: 8 % — AB (ref 4.0–5.6)

## 2020-03-31 MED ORDER — TOUJEO MAX SOLOSTAR 300 UNIT/ML ~~LOC~~ SOPN: 22.0000 [IU] | PEN_INJECTOR | Freq: Every day | SUBCUTANEOUS | Status: DC

## 2020-03-31 NOTE — Patient Instructions (Addendum)
Please continue: - Metformin ER 1000 mg 2x a day - Glipizide 5 mg 30 min before b'fast and dinner  Please increase: - Toujeo 22 units daily  Please return in 3 months with your sugar log.

## 2020-03-31 NOTE — Progress Notes (Signed)
Patient ID: Colleen Henry, female   DOB: Jun 16, 1960, 59 y.o.   MRN: [id number]  This visit occurred during the SARS-CoV-2 public health emergency.  Safety protocols were in place, including screening questions prior to the visit, additional usage of staff PPE, and extensive cleaning of exam room while observing appropriate contact time as indicated for disinfecting solutions.   HPI: Colleen Henry is a 59 y.o.-year-old female, returning for f/u for DM2, dx in ~2011, insulin-dependent since 10/2019, uncontrolled, without complications. Last visit 3.5 months ago.  Reviewed HbA1c levels: Lab Results  Component Value Date   HGBA1C 9.2 (A) 11/08/2019   HGBA1C 8.0 (A) 02/28/2019   HGBA1C 9.0 (A) 11/24/2017  11/2014: HbA1c 9.9% 09/17/2014: HbA1c 12.2% 04/11/2014: HbA1c 8.6%  Pt was on a regimen of: - Metformin XR 1000 mg bid - cannot remember what happened with reg metformin - Farxiga 10 mg daily in am - started 07/2015 >> switched to Invokana but ran out  - Biocell: Biotin, Zinc, Chromium She stopped Amaryl - was taking 2 mg before b'fast Insulin was suggested to her >> too expensive 300$/mo  She is on: - Glipizide 5 mg 2x a day before meals - Metformin ER 1000 mg 2x a day - Tresiba >> Toujeo 14 units daily  - added 10/2019 >> 18 units daily Refused Trulicity in the past, including 03/31/2020  Pt checks her sugars 0-1x a day -will only checking in the morning: - am: 150-178 >> 131-175 in 09/2019, 195-236 >> 144-168 >> 140-160, 197 - 2h after b'fast: n/c - before lunch: n/c - 2h after lunch: 170 >> n/c >> 114 >> n/c  - before dinner: n/c >> 96 >> n/c - 2h after dinner: 136-168 >> 205, 255, 297, 303 >> 134, 158 >> n/c - bedtime: n/c >> 97, 140s >> 232 >> n/c - nighttime: n/c >> 114 >> n/c Lowest sugar was 131 >> 96 >> 46 (very light lunch and skipped her pm snack); it is unclear at which CBG level she has hypoglycemia awareness. Highest sugar was 303 >> 168 >> 197.  Glucometer:  UltraTrak  -No CKD but she does have microalbuminuria, last BUN/creatinine:  12/26/2019: Glucose 135, BUN/creatinine 13/0.46, GFR 140 Lab Results  Component Value Date   BUN 12 02/28/2019   CREATININE 0.47 (L) 02/28/2019   Lab Results  Component Value Date   MICRALBCREAT 4.5 02/28/2019   10/06/2017: Glucose 126, BUN/creatinine 11/0.43, GFR 153, ACR 71.6 02/17/2016: ACR 66.8 07/31/2015: 14/0.41, eGFR 162 01/29/2015: ACR 31.6. On lisinopril.  -+ HL; last set of lipids: 12/26/2019: 216/88/62/139 Lab Results  Component Value Date   CHOL 203 (H) 02/28/2019   HDL 52.00 02/28/2019   LDLCALC 130 (H) 02/28/2019   TRIG 105.0 02/28/2019   CHOLHDL 4 02/28/2019  10/06/2017: 203/112/49/132 04/13/2017: 213/104/54/139 07/31/2015: 190/75/54/120 09/17/2014: 183/109/47/114 04/11/2014: 189/87/48/124  She refused statins.  - last eye exam was on 07/2018: + DR, was getting IO injections - not during the pandemic.  - + numbness and tingling in her feet.  She is not walking 2/2 knee pain.  ROS: Constitutional: + weight gain/no weight loss, no fatigue, no subjective hyperthermia, no subjective hypothermia Eyes: no blurry vision, no xerophthalmia ENT: no sore throat, no nodules palpated in neck, no dysphagia, no odynophagia, no hoarseness Cardiovascular: no CP/no SOB/no palpitations/no leg swelling Respiratory: no cough/no SOB/no wheezing Gastrointestinal: no N/no V/no D/no C/no acid reflux Musculoskeletal: no muscle aches/+ joint aches (knee pain - L) Skin: no rashes, no hair loss Neurological: no  tremors/+ numbness/+ tingling/no dizziness  I reviewed pt's medications, allergies, PMH, social hx, family hx, and changes were documented in the history of present illness. Otherwise, unchanged from my initial visit note.  Past Medical History:  Diagnosis Date  . Complication of anesthesia    agitation upon waking up as a child .   Marland Kitchen Depression   . Diabetes mellitus    oral meds for diabetes  . Type 2  . Hypertension   . Snores    Past Surgical History:  Procedure Laterality Date  . BREAST BIOPSY    . CESAREAN SECTION  01/16/1998  . HERNIA REPAIR  2013  . I & D EXTREMITY    . INSERTION OF MESH  04/20/2012   Procedure: INSERTION OF MESH;  Surgeon: Adin Hector, MD;  Location: [facility];  Service: General;  Laterality: N/A;  . MASS EXCISION Right 04/19/2016   Procedure: RIGHT SMALL FINGER EXCISION MASS;  Surgeon: Daryll Brod, MD;  Location: [facility];  Service: Orthopedics;  Laterality: Right;  . TONSILLECTOMY AND ADENOIDECTOMY  age 43  . VENTRAL HERNIA REPAIR  04/20/2012   Procedure: LAPAROSCOPIC VENTRAL HERNIA;  Surgeon: Adin Hector, MD;  Location: [place];  Service: General;  Laterality: N/A;  laparoscopic ventral wall hernia repair  . VENTRAL HERNIA REPAIR  04/20/2012   Procedure: HERNIA REPAIR VENTRAL ADULT;  Surgeon: Adin Hector, MD;  Location: [place];  Service: General;  Laterality: N/A;  Repair incarcerated ventral hernia times two.   History   Social History  . Marital Status: Married    Spouse Name: N/A  . Number of Children: 1   Occupational History  .  office administrator   Social History Main Topics  . Smoking status: Never Smoker   . Smokeless tobacco: Never Used  . Alcohol Use: Yes     Comment: occasionally - socially -   Wine or vodka once or twice a month , 2-3 drinks  . Drug Use: No   Current Outpatient Medications on File Prior to Visit  Medication Sig Dispense Refill  . glipiZIDE (GLUCOTROL) 5 MG tablet TAKE 1 TABLET BY MOUTH TWICE DAILY WITH MEALS 180 tablet 3  . insulin glargine, 2 Unit Dial, (TOUJEO MAX SOLOSTAR) 300 UNIT/ML Solostar Pen Inject 18 Units into the skin at bedtime. 3 pen 2  . Insulin Pen Needle 32G X 4 MM MISC Use 1x a day 100 each 3  . lisinopril-hydrochlorothiazide (PRINZIDE,ZESTORETIC) 20-25 MG tablet Take 1 tablet by mouth daily.    . metFORMIN (GLUCOPHAGE-XR) 500 MG 24 hr tablet TAKE 2 TABLETS BY MOUTH  TWICE DAILY WITHA MEAL 360 tablet 3  . sertraline (ZOLOFT) 50 MG tablet Take 50 mg by mouth daily.     No current facility-administered medications on file prior to visit.   Allergies  Allergen Reactions  . Penicillins Hives    Tolerates keflex   The patient has a family history of Family History  Problem Relation Age of Onset  . Hypertension Mother   . Hyperlipidemia Mother   . Diabetes Maternal Grandmother   . Heart disease Maternal Grandfather   . Hyperlipidemia Maternal Grandfather    PE: BP (!) 144/92   Pulse 90   Ht 5' (1.524 m)   Wt 242 lb (109.8 kg)   SpO2 96%   BMI 47.26 kg/m  Body mass index is 47.26 kg/m. Wt Readings from Last 3 Encounters:  03/31/20 242 lb (109.8 kg)  12/20/19 (!) 239 lb (108.4 kg)  11/08/19 234 lb (106.1 kg)   Constitutional: overweight, in NAD Eyes: PERRLA, EOMI, no exophthalmos ENT: moist mucous membranes, no thyromegaly, no cervical lymphadenopathy Cardiovascular: RRR, No MRG Respiratory: CTA B Gastrointestinal: abdomen soft, NT, ND, BS+ Musculoskeletal: no deformities, strength intact in all 4 Skin: moist, warm, no rashes Neurological: no tremor with outstretched hands, DTR normal in all 4  ASSESSMENT: 1. DM2,  Now insulin-dependent, uncontrolled, without long-term complications, but with hyperglycemia  2. Obesity class 3  3. HL  PLAN:  1. Patient with longstanding, uncontrolled, type 2 diabetes, on Metformin, sulfonylurea, and long-acting insulin with improved sugars at last visit, after a significant increase before this, when she was lost for follow-up for ~8 months.  At last visit, sugars were only slightly above goal in the morning and they were at goal after dinner.  We continued her on medications and increase the Toujeo dose slightly to improve her morning sugars.  Of note, she refused injectable medicines in the past but she is doing well with insulin injections. -Reviewed latest HbA1c from 10/2019 and this was high, at  9.2% - a today's visit, sugars are still high in the morning and she is not checking them later in the day, unfortunately.  I advised her that the edema is very important to rotate the sugar checks to also include some later in the day.  However, since the blood sugars in the morning are still above target, we discussed about increasing the dose of Toujeo.  Alternatively, we discussed about adding a GLP-1 receptor agonist but she would not want to do this for now. - I suggested to:  Patient Instructions  Please continue: - Metformin ER 1000 mg 2x a day - Glipizide 5 mg 30 min before b'fast and dinner  Please increase: - Toujeo 22 units daily  Please return in 3 months with your sugar log.   - we checked her HbA1c: 8.0% (lower) - advised to check sugars at different times of the day - 1-2x a day, rotating check times - advised for yearly eye exams >> she is UTD - return to clinic in 3-4 months  2. Obesity class 3 -She refuses a GLP-1 receptor agonist again today.  This would also help with weight loss -I filled out a LifeScreen form for her before that visit with the expected BMI for next year  3. HL -Reviewed latest lipid panel from 11/2019: LDL above target; the rest of the fractions at goal:  216/88/62/139 -She refused statins in the past  Philemon Kingdom, MD PhD [facility]

## 2020-03-31 NOTE — Addendum Note (Signed)
Addended by: Caprice Beaver T on: 03/31/2020 01:29 PM   Modules accepted: Orders

## 2020-05-06 ENCOUNTER — Other Ambulatory Visit: Payer: Self-pay | Admitting: Internal Medicine

## 2020-06-10 ENCOUNTER — Other Ambulatory Visit: Payer: Self-pay | Admitting: Obstetrics and Gynecology

## 2020-06-11 DIAGNOSIS — N644 Mastodynia: Secondary | ICD-10-CM | POA: Diagnosis not present

## 2020-06-12 ENCOUNTER — Other Ambulatory Visit: Payer: Self-pay | Admitting: Obstetrics and Gynecology

## 2020-06-12 DIAGNOSIS — N644 Mastodynia: Secondary | ICD-10-CM

## 2020-06-29 ENCOUNTER — Other Ambulatory Visit: Payer: Self-pay

## 2020-06-29 ENCOUNTER — Ambulatory Visit
Admission: RE | Admit: 2020-06-29 | Discharge: 2020-06-29 | Disposition: A | Discharge status: Home / Self Care | Payer: BC Managed Care – PPO | Source: Ambulatory Visit | Attending: Obstetrics and Gynecology | Admitting: Obstetrics and Gynecology

## 2020-06-29 ENCOUNTER — Other Ambulatory Visit: Payer: Self-pay | Admitting: Obstetrics and Gynecology

## 2020-06-29 ENCOUNTER — Ambulatory Visit: Payer: BC Managed Care – PPO

## 2020-06-29 DIAGNOSIS — N644 Mastodynia: Secondary | ICD-10-CM

## 2020-06-29 DIAGNOSIS — R922 Inconclusive mammogram: Secondary | ICD-10-CM | POA: Diagnosis not present

## 2020-07-14 ENCOUNTER — Other Ambulatory Visit: Payer: Self-pay | Admitting: Obstetrics and Gynecology

## 2020-07-14 DIAGNOSIS — Z0000 Encounter for general adult medical examination without abnormal findings: Secondary | ICD-10-CM

## 2020-07-31 ENCOUNTER — Ambulatory Visit: Payer: BC Managed Care – PPO | Admitting: Internal Medicine

## 2020-07-31 ENCOUNTER — Other Ambulatory Visit: Payer: Self-pay | Admitting: Internal Medicine

## 2020-09-02 ENCOUNTER — Other Ambulatory Visit: Payer: Self-pay

## 2020-09-02 ENCOUNTER — Ambulatory Visit
Admission: RE | Admit: 2020-09-02 | Discharge: 2020-09-02 | Disposition: A | Discharge status: Home / Self Care | Payer: BC Managed Care – PPO | Source: Ambulatory Visit | Attending: Obstetrics and Gynecology | Admitting: Obstetrics and Gynecology

## 2020-09-02 DIAGNOSIS — Z0000 Encounter for general adult medical examination without abnormal findings: Secondary | ICD-10-CM

## 2020-09-02 DIAGNOSIS — Z1231 Encounter for screening mammogram for malignant neoplasm of breast: Secondary | ICD-10-CM | POA: Diagnosis not present

## 2020-09-22 ENCOUNTER — Encounter: Payer: Self-pay | Admitting: Internal Medicine

## 2020-09-22 ENCOUNTER — Other Ambulatory Visit: Payer: Self-pay

## 2020-09-22 ENCOUNTER — Ambulatory Visit (INDEPENDENT_AMBULATORY_CARE_PROVIDER_SITE_OTHER): Payer: BC Managed Care – PPO | Admitting: Internal Medicine

## 2020-09-22 VITALS — BP 140/100 | HR 83 | Ht 60.0 in | Wt 234.2 lb

## 2020-09-22 DIAGNOSIS — Z794 Long term (current) use of insulin: Secondary | ICD-10-CM | POA: Diagnosis not present

## 2020-09-22 DIAGNOSIS — E1165 Type 2 diabetes mellitus with hyperglycemia: Secondary | ICD-10-CM

## 2020-09-22 DIAGNOSIS — E785 Hyperlipidemia, unspecified: Secondary | ICD-10-CM | POA: Diagnosis not present

## 2020-09-22 LAB — POCT GLYCOSYLATED HEMOGLOBIN (HGB A1C): Hemoglobin A1C: 12 % — AB (ref 4.0–5.6)

## 2020-09-22 MED ORDER — TOUJEO MAX SOLOSTAR 300 UNIT/ML ~~LOC~~ SOPN: PEN_INJECTOR | SUBCUTANEOUS | 1 refills | Status: DC

## 2020-09-22 MED ORDER — OZEMPIC (0.25 OR 0.5 MG/DOSE) 2 MG/1.5ML ~~LOC~~ SOPN: 0.5000 mg | PEN_INJECTOR | SUBCUTANEOUS | 3 refills | Status: DC

## 2020-09-22 NOTE — Progress Notes (Signed)
Patient ID: Colleen Henry, female   DOB: 10-17-1960, 60 y.o.   MRN: [id number]  This visit occurred during the SARS-CoV-2 public health emergency.  Safety protocols were in place, including screening questions prior to the visit, additional usage of staff PPE, and extensive cleaning of exam room while observing appropriate contact time as indicated for disinfecting solutions.   HPI: Colleen Henry is a 60 y.o.-year-old female, returning for f/u for DM2, dx in ~2011, insulin-dependent since 10/2019, uncontrolled, without complications. Last visit 6 months ago.  Interim history: She stopped her Toujeo last month and just restarted it 2 to 3 weeks ago after sugars increased significantly, to 300s in the morning She also came off glipizide few months ago. She started to have increased urination and drinks plenty of water. She also lost 8 pounds since last visit.  Reviewed HbA1c levels: Lab Results  Component Value Date   HGBA1C 8.0 (A) 03/31/2020   HGBA1C 9.2 (A) 11/08/2019   HGBA1C 8.0 (A) 02/28/2019  11/2014: HbA1c 9.9% 09/17/2014: HbA1c 12.2% 04/11/2014: HbA1c 8.6%  Pt was on a regimen of: - Metformin XR 1000 mg bid - cannot remember what happened with reg metformin - Farxiga 10 mg daily in am - started 07/2015 >> switched to Invokana but ran out  - Biocell: Biotin, Zinc, Chromium She stopped Amaryl - was taking 2 mg before b'fast Insulin was suggested to her >> too expensive 300$/mo  She is on: - Glipizide 5 mg 2x a day before meals >> stopped few months ago - Metformin ER 1000 mg 2x a day - Tresiba >> Toujeo 14 units daily  - added 10/2019 >> 18 >> 22 units daily - stopped ~1 mo ago after running out - restarted 2-3 weeks ago at 14 >> 20 Refused Trulicity in the past, including 03/31/2020  Pt checks her sugars 0-1x a day: - am: 131-175 in 09/2019, 195-236 >> 144-168 >> 140-160, 197 >> 250s-300s - 2h after b'fast: n/c - before lunch: n/c - 2h after lunch: 170 >> n/c >> 114 >>  n/c  - before dinner: n/c >> 96 >> n/c - 2h after dinner: 136-168 >> 205, 255, 297, 303 >> 134, 158 >> n/c - bedtime: n/c >> 97, 140s >> 232 >> n/c - nighttime: n/c >> 114 >> n/c Lowest sugar was 131 >> 96 >> 46 >> 200 (very light lunch and skipped her pm snack); it is unclear at which CBG level she has hypoglycemia awareness. Highest sugar was 303 >> 168 >> 197 >> 380s.  Glucometer: UltraTrak  -No CKD but she does have microalbuminuria, last BUN/creatinine:  03/31/2020: ACR 128.8 12/26/2019: Glucose 135, BUN/creatinine 13/0.46, GFR 140 Lab Results  Component Value Date   BUN 12 02/28/2019   CREATININE 0.47 (L) 02/28/2019   Lab Results  Component Value Date   MICRALBCREAT 4.5 02/28/2019   10/06/2017: Glucose 126, BUN/creatinine 11/0.43, GFR 153, ACR 71.6 02/17/2016: ACR 66.8 07/31/2015: 14/0.41, eGFR 162 01/29/2015: ACR 31.6. On lisinopril.  -+ HL; last set of lipids: 12/26/2019: 216/88/62/139 Lab Results  Component Value Date   CHOL 203 (H) 02/28/2019   HDL 52.00 02/28/2019   LDLCALC 130 (H) 02/28/2019   TRIG 105.0 02/28/2019   CHOLHDL 4 02/28/2019  10/06/2017: 203/112/49/132 04/13/2017: 213/104/54/139 07/31/2015: 190/75/54/120 09/17/2014: 183/109/47/114 04/11/2014: 189/87/48/124  She refused statins.  - last eye exam was on 07/2018: + DR, was getting IO injections - not during the pandemic. Scheduled for next mo.  - + numbness and tingling in her feet.  She is not walking 2/2 knee pain.  ROS: + See HPI Constitutional: no weight gain/+ weight loss, no fatigue, no subjective hyperthermia, no subjective hypothermia Eyes: no blurry vision, no xerophthalmia ENT: no sore throat, no nodules palpated in neck, no dysphagia, no odynophagia, no hoarseness Cardiovascular: no CP/no SOB/no palpitations/no leg swelling Respiratory: no cough/no SOB/no wheezing Gastrointestinal: no N/no V/no D/no C/no acid reflux Musculoskeletal: no muscle aches/joint aches Skin: no rashes, no  hair loss Neurological: no tremors/+ numbness/+ tingling/no dizziness  I reviewed pt's medications, allergies, PMH, social hx, family hx, and changes were documented in the history of present illness. Otherwise, unchanged from my initial visit note.  Past Medical History:  Diagnosis Date  . Complication of anesthesia    agitation upon waking up as a child .   Marland Kitchen Depression   . Diabetes mellitus    oral meds for diabetes . Type 2  . Hypertension   . Snores    Past Surgical History:  Procedure Laterality Date  . BREAST CYST ASPIRATION    . CESAREAN SECTION  01/16/1998  . HERNIA REPAIR  2013  . I & D EXTREMITY    . INSERTION OF MESH  04/20/2012   Procedure: INSERTION OF MESH;  Surgeon: Adin Hector, MD;  Location: [place];  Service: General;  Laterality: N/A;  . MASS EXCISION Right 04/19/2016   Procedure: RIGHT SMALL FINGER EXCISION MASS;  Surgeon: Daryll Brod, MD;  Location: [place];  Service: Orthopedics;  Laterality: Right;  . TONSILLECTOMY AND ADENOIDECTOMY  age 60  . VENTRAL HERNIA REPAIR  04/20/2012   Procedure: LAPAROSCOPIC VENTRAL HERNIA;  Surgeon: Adin Hector, MD;  Location: [place];  Service: General;  Laterality: N/A;  laparoscopic ventral wall hernia repair  . VENTRAL HERNIA REPAIR  04/20/2012   Procedure: HERNIA REPAIR VENTRAL ADULT;  Surgeon: Adin Hector, MD;  Location: [place];  Service: General;  Laterality: N/A;  Repair incarcerated ventral hernia times two.   History   Social History  . Marital Status: Married    Spouse Name: N/A  . Number of Children: 1   Occupational History  .  office administrator   Social History Main Topics  . Smoking status: Never Smoker   . Smokeless tobacco: Never Used  . Alcohol Use: Yes     Comment: occasionally - socially -   Wine or vodka once or twice a month , 2-3 drinks  . Drug Use: No   Current Outpatient Medications on File Prior to Visit  Medication Sig Dispense Refill  . escitalopram (LEXAPRO) 10  MG tablet Take 10 mg by mouth daily.    Marland Kitchen glipiZIDE (GLUCOTROL) 5 MG tablet TAKE 1 TABLET BY MOUTH TWICE DAILY WITH MEALS 180 tablet 3  . Insulin Pen Needle 32G X 4 MM MISC Use 1x a day 100 each 3  . lisinopril-hydrochlorothiazide (PRINZIDE,ZESTORETIC) 20-25 MG tablet Take 1 tablet by mouth daily.    . metFORMIN (GLUCOPHAGE-XR) 500 MG 24 hr tablet TAKE 2 TABLETS BY MOUTH TWICE DAILY WITHA MEAL 360 tablet 3  . TOUJEO MAX SOLOSTAR 300 UNIT/ML Solostar Pen INJECT 14 UNITS INTO THE SKIN ONCE DAILYAT BEDTIME 6 mL 1   No current facility-administered medications on file prior to visit.   Allergies  Allergen Reactions  . Penicillins Hives    Tolerates keflex   The patient has a family history of Family History  Problem Relation Age of Onset  . Hypertension Mother   . Hyperlipidemia Mother   .  Diabetes Maternal Grandmother   . Heart disease Maternal Grandfather   . Hyperlipidemia Maternal Grandfather    PE: BP (!) 140/100 (BP Location: Right Arm, Patient Position: Sitting, Cuff Size: Normal)   Pulse 83   Ht 5' (1.524 m)   Wt 234 lb 3.2 oz (106.2 kg)   LMP 11/12/2015   SpO2 97%   BMI 45.74 kg/m  Body mass index is 45.74 kg/m. Wt Readings from Last 3 Encounters:  09/22/20 234 lb 3.2 oz (106.2 kg)  03/31/20 242 lb (109.8 kg)  12/20/19 (!) 239 lb (108.4 kg)   Constitutional: overweight, in NAD Eyes: PERRLA, EOMI, no exophthalmos ENT: moist mucous membranes, no thyromegaly, no cervical lymphadenopathy Cardiovascular: RRR, No MRG Respiratory: CTA B Gastrointestinal: abdomen soft, NT, ND, BS+ Musculoskeletal: no deformities, strength intact in all 4 Skin: moist, warm, no rashes Neurological: no tremor with outstretched hands, DTR normal in all 4  ASSESSMENT: 1. DM2,  insulin-dependent, uncontrolled, without long-term complications, but with hyperglycemia  No family history of medullary thyroid cancer or personal history of pancreatitis.  2. Obesity class 3  3. HL  PLAN:  1.  Patient with longstanding, uncontrolled, type 2 diabetes, on metformin, sulfonylurea, and long-acting insulin, with improved control at last visit-HbA1c 8.0%, decreased from 9.2%.  At that time, I advised her to rotate her blood sugar checks, but since the glucose in the morning was higher than target, we increased her Toujeo dose.  We did discuss about adding a GLP-1 receptor agonist but she declined at that time. -At this visit, unfortunately, she came off her Toujeo for a period of time, during which the blood sugars increase significantly, to 300s.  She also stopped glipizide completely months ago.  She is not checking sugars consistently.  At today's visit, HbA1c: 12% (abysmal) -We discussed at this visit that if she is not following instructions and take her medicines as prescribed, unfortunately, I will not be able to help her.  She agrees to continue on the Toujeo and we will increase the dose.  We will also add Ozempic at this point.  I advised her about the benefits and possible side effects.  Discussed the mechanism of action.  We will start at a low dose and increase as tolerated.  I do not see any benefit from adding glipizide for now, in the setting of significant glucose toxicity.  We may be able to use this again in the future. - I suggested to:  Patient Instructions  Please continue: - Metformin ER 1000 mg 2x a day  Increase: - Toujeo 24 units, then increase further every 2 days until you reach 30 units daily  Please start: - Ozempic 0.25 mg weekly in a.m. (for example on Sunday morning) x 2-4 weeks, then increase to 0.5 mg weekly in a.m. if no nausea or hypoglycemia.  Please return in 1.5-2 months with your sugar log.   - advised to check sugars at different times of the day - 1-2x a day, rotating check times - advised for yearly eye exams >> she is not UTD - return to clinic in 1.5-2 months  2. Obesity class 3 -She refused a GLP-1 receptor agonist at last visit.  We will add  this today.  I explained that this will help with weight loss. -At last visit, I filled out a LifeScreen form for her before that visit with the expected BMI for the coming year -She lost 8 pounds since last visit, most likely due to glucotoxicity  3.  HL -Reviewed latest lipid panel from 11/2019: LDL above target, the rest of the fractions at goal:  216/88/62/139 -She refused statins.  Philemon Kingdom, MD PhD [facility] Endocrinology

## 2020-09-22 NOTE — Patient Instructions (Addendum)
Please continue: - Metformin ER 1000 mg 2x a day  Increase: - Toujeo 24 units, then increase further every 2 days until you reach 30 units daily  Please start: - Ozempic 0.25 mg weekly in a.m. (for example on Sunday morning) x 2-4 weeks, then increase to 0.5 mg weekly in a.m. if no nausea or hypoglycemia.  Please return in 1.5-2 months with your sugar log.

## 2020-10-06 DIAGNOSIS — E119 Type 2 diabetes mellitus without complications: Secondary | ICD-10-CM | POA: Diagnosis not present

## 2020-10-06 DIAGNOSIS — H35033 Hypertensive retinopathy, bilateral: Secondary | ICD-10-CM | POA: Diagnosis not present

## 2020-11-20 ENCOUNTER — Encounter: Payer: Self-pay | Admitting: Internal Medicine

## 2020-11-24 ENCOUNTER — Other Ambulatory Visit: Payer: Self-pay

## 2020-11-24 ENCOUNTER — Encounter: Payer: Self-pay | Admitting: Internal Medicine

## 2020-11-24 ENCOUNTER — Ambulatory Visit: Payer: BC Managed Care – PPO | Admitting: Internal Medicine

## 2020-11-24 VITALS — BP 142/80 | HR 67 | Wt 227.0 lb

## 2020-11-24 DIAGNOSIS — E1165 Type 2 diabetes mellitus with hyperglycemia: Secondary | ICD-10-CM

## 2020-11-24 DIAGNOSIS — Z794 Long term (current) use of insulin: Secondary | ICD-10-CM

## 2020-11-24 DIAGNOSIS — E785 Hyperlipidemia, unspecified: Secondary | ICD-10-CM

## 2020-11-24 LAB — POCT GLYCOSYLATED HEMOGLOBIN (HGB A1C): Hemoglobin A1C: 9 % — AB (ref 4.0–5.6)

## 2020-11-24 NOTE — Addendum Note (Signed)
Addended by: Tyrone Apple on: 11/24/2020 01:57 PM   Modules accepted: Orders

## 2020-11-24 NOTE — Progress Notes (Signed)
Patient ID: Colleen Henry, female   DOB: [birth year], 60 y.o.   MRN: [id number]  This visit occurred during the SARS-CoV-2 public health emergency.  Safety protocols were in place, including screening questions prior to the visit, additional usage of staff PPE, and extensive cleaning of exam room while observing appropriate contact time as indicated for disinfecting solutions.   HPI: Colleen Henry is a 60 y.o.-year-old female, returning for f/u for DM2, dx in ~2011, insulin-dependent since 10/2019, uncontrolled, without complications. Last visit 2 months ago.  Interim history: At last visit, she returned with very high blood sugars after she stopped her sulfonylurea and long-acting insulin.  We restarted her long-acting insulin and added Ozempic.  She initially had gas and some abdominal pain, but she started to take Mylanta before and after the injection and now she tolerates it very well.  She feels much better, her increased urination resolved.  She also started to lose weight. She was out of Toujeo for last 3 days - ran out - restarted last night. She and her husband had Covid19 last month (mild form). Knee pain is better.  Reviewed HbA1c levels: Lab Results  Component Value Date   HGBA1C 12.0 (A) 09/22/2020   HGBA1C 8.0 (A) 03/31/2020   HGBA1C 9.2 (A) 11/08/2019  11/2014: HbA1c 9.9% 09/17/2014: HbA1c 12.2% 04/11/2014: HbA1c 8.6%  Pt was on a regimen of: - Metformin XR 1000 mg bid - cannot remember what happened with reg metformin - Farxiga 10 mg daily in am - started 07/2015 >> switched to Invokana but ran out  - Biocell: Biotin, Zinc, Chromium She stopped Amaryl - was taking 2 mg before b'fast Insulin was suggested to her >> too expensive 300$/mo  She is on: - >> stopped few months prior to last appointment - Metformin ER 1000 mg 2x a day - Tresiba >> Toujeo 14 units daily  - added 10/2019 >> off 08/2020 >> restarted at 20 units daily - Ozempic 0.25 mg weekly-added  26/3335 Refused Trulicity in the past, including 03/31/2020  Pt checks her sugars 0-1x a day: - am: 144-168 >> 140-160, 197 >> 250s-300s >> 113-157, 169, 183 - 2h after b'fast: n/c - before lunch: n/c - 2h after lunch: 170 >> n/c >> 114 >> n/c  - before dinner: n/c >> 96 >> n/c - 2h after dinner: 205, 255, 297, 303 >> 134, 158 >> n/c - bedtime: n/c >> 97, 140s >> 232 >> n/c - nighttime: n/c >> 114 >> n/c Lowest sugar was 46 >> 200 >> 113; it is unclear at which CBG level she has hypoglycemia awareness. Highest sugar was 197 >> 380s >> 183.  Glucometer: UltraTrak  -No CKD but she does have microalbuminuria, last BUN/creatinine:  03/31/2020: ACR 128.8 12/26/2019: Glucose 135, BUN/creatinine 13/0.46, GFR 140 Lab Results  Component Value Date   BUN 12 02/28/2019   CREATININE 0.47 (L) 02/28/2019   Lab Results  Component Value Date   MICRALBCREAT 4.5 02/28/2019   10/06/2017: Glucose 126, BUN/creatinine 11/0.43, GFR 153, ACR 71.6 02/17/2016: ACR 66.8 07/31/2015: 14/0.41, eGFR 162 01/29/2015: ACR 31.6. On lisinopril.  -+ HL; last set of lipids: 12/26/2019: 216/88/62/139 Lab Results  Component Value Date   CHOL 203 (H) 02/28/2019   HDL 52.00 02/28/2019   LDLCALC 130 (H) 02/28/2019   TRIG 105.0 02/28/2019   CHOLHDL 4 02/28/2019  10/06/2017: 203/112/49/132 04/13/2017: 213/104/54/139 07/31/2015: 190/75/54/120 09/17/2014: 183/109/47/114 04/11/2014: 189/87/48/124  She refused statins.  - last eye exam was on 10/06/2020: + DR, was  getting IO injections - not during the pandemic or now. Scheduled for next mo.  - + numbness and tingling in her feet.  ROS:  + See HPI  Past Medical History:  Diagnosis Date   Complication of anesthesia    agitation upon waking up as a child .    Depression    Diabetes mellitus    oral meds for diabetes . Type 2   Hypertension    Snores    Past Surgical History:  Procedure Laterality Date   BREAST CYST ASPIRATION     CESAREAN SECTION   01/16/1998   HERNIA REPAIR  2013   I & D EXTREMITY     INSERTION OF MESH  04/20/2012   Procedure: INSERTION OF MESH;  Surgeon: Adin Hector, MD;  Location: [place];  Service: General;  Laterality: N/A;   MASS EXCISION Right 04/19/2016   Procedure: RIGHT SMALL FINGER EXCISION MASS;  Surgeon: Daryll Brod, MD;  Location: [place];  Service: Orthopedics;  Laterality: Right;   TONSILLECTOMY AND ADENOIDECTOMY  age 36   VENTRAL HERNIA REPAIR  04/20/2012   Procedure: LAPAROSCOPIC VENTRAL HERNIA;  Surgeon: Adin Hector, MD;  Location: [place];  Service: General;  Laterality: N/A;  laparoscopic ventral wall hernia repair   VENTRAL HERNIA REPAIR  04/20/2012   Procedure: HERNIA REPAIR VENTRAL ADULT;  Surgeon: Adin Hector, MD;  Location: [place];  Service: General;  Laterality: N/A;  Repair incarcerated ventral hernia times two.   History   Social History   Marital Status: Married    Spouse Name: N/A   Number of Children: 1   Occupational History    Marketing executive   Social History Main Topics   Smoking status: Never Smoker    Smokeless tobacco: Never Used   Alcohol Use: Yes     Comment: occasionally - socially -   Wine or vodka once or twice a month , 2-3 drinks   Drug Use: No   Current Outpatient Medications on File Prior to Visit  Medication Sig Dispense Refill   escitalopram (LEXAPRO) 10 MG tablet Take 10 mg by mouth daily.     glipiZIDE (GLUCOTROL) 5 MG tablet TAKE 1 TABLET BY MOUTH TWICE DAILY WITH MEALS (Patient not taking: Reported on 09/22/2020) 180 tablet 3   insulin glargine, 2 Unit Dial, (TOUJEO MAX SOLOSTAR) 300 UNIT/ML Solostar Pen INJECT 30 UNITS INTO THE SKIN ONCE DAILYAT BEDTIME 6 mL 1   Insulin Pen Needle 32G X 4 MM MISC Use 1x a day 100 each 3   lisinopril-hydrochlorothiazide (PRINZIDE,ZESTORETIC) 20-25 MG tablet Take 1 tablet by mouth daily.     metFORMIN (GLUCOPHAGE-XR) 500 MG 24 hr tablet TAKE 2 TABLETS BY MOUTH TWICE DAILY WITHA MEAL 360 tablet 3    Semaglutide,0.25 or 0.5MG/DOS, (OZEMPIC, 0.25 OR 0.5 MG/DOSE,) 2 MG/1.5ML SOPN Inject 0.5 mg into the skin once a week. 4.5 mL 3   No current facility-administered medications on file prior to visit.   Allergies  Allergen Reactions   Penicillins Hives    Tolerates keflex   Family History  Problem Relation Age of Onset   Hypertension Mother    Hyperlipidemia Mother    Diabetes Maternal Grandmother    Heart disease Maternal Grandfather    Hyperlipidemia Maternal Grandfather    PE: LMP 11/12/2015  There is no height or weight on file to calculate BMI. Wt Readings from Last 3 Encounters:  11/24/20 227 lb (103 kg)  09/22/20 234 lb 3.2 oz (  106.2 kg)  03/31/20 242 lb (109.8 kg)   Constitutional: overweight, in NAD Eyes: PERRLA, EOMI, no exophthalmos ENT: moist mucous membranes, no thyromegaly, no cervical lymphadenopathy Cardiovascular: RRR, No MRG Respiratory: CTA B Gastrointestinal: abdomen soft, NT, ND, BS+ Musculoskeletal: no deformities, strength intact in all 4 Skin: moist, warm, no rashes Neurological: no tremor with outstretched hands, DTR normal in all 4  ASSESSMENT: 1. DM2,  insulin-dependent, uncontrolled, without long-term complications, but with hyperglycemia  No family history of medullary thyroid cancer or personal history of pancreatitis.  2. Obesity class 3  3. HL  PLAN:  1. Patient with longstanding, uncontrolled, type 2 diabetes, on metformin, long-acting insulin, and now GLP-1 receptor agonist added at last visit.  She previously refused starting this.  However, at last visit, she came off her Toujeo for a longer period of time during which the blood sugars increased significantly, to 300s.  She was also off glipizide at that time.  HbA1c was abysmal, at 12%.  At that time, we discussed that if she was not following instructions and take her medications as prescribed, unfortunately, I would not be able to help her.   -However, she now returns after 2  months, with blood sugars improved by almost 50%.  She tolerates Ozempic well and started to lose weight on it.  Upon questioning, she is still using the lowest dose, 0.25 mg weekly, and, since sugars are still above target, we will go ahead and increase this.  Since she is afraid that she may develop some GI side effects, I advised her to go slowly between 0.25 and 0.5 mg weekly. -For now, we will continue the rest of the regimen. - I suggested to:  Patient Instructions  Please continue: - Metformin ER 1000 mg 2x a day - Toujeo 20 units daily  Try to increase: - Ozempic 0.5 mg weekly in a.m.   Please return in 3-4 months with your sugar log.   - we checked her HbA1c: 9% (much better) - advised to check sugars at different times of the day - 1x a day, rotating check times - advised for yearly eye exams >> she is UTD - return to clinic in 3-4 months  2. Obesity class 3 -She initially refused a GLP-1 receptor agonist but at last visit she accepted to try Ozempic.  She continues on this, but on the lowest dose.  We will try to increase the dose - this should also help with weight loss. -Before last visit she lost 8 pounds, most likely due to glucotoxicity; she lost 7 pounds since then, most likely Ozempic effect  3. HL -Reviewed latest lipid panel from 11/2019: LDL above target, rest of the fractions at goal: 216/88/62/139 -She refused statins  Philemon Kingdom, MD PhD [facility] Endocrinology

## 2020-11-24 NOTE — Patient Instructions (Addendum)
Please continue: - Metformin ER 1000 mg 2x a day - Toujeo 20 units daily  Try to increase: - Ozempic 0.5 mg weekly in a.m.   Please return in 3-4 months with your sugar log.

## 2020-11-25 NOTE — Telephone Encounter (Signed)
This was addressed at patient appt .

## 2020-12-17 DIAGNOSIS — Z1211 Encounter for screening for malignant neoplasm of colon: Secondary | ICD-10-CM | POA: Diagnosis not present

## 2020-12-17 DIAGNOSIS — Z8371 Family history of colonic polyps: Secondary | ICD-10-CM | POA: Diagnosis not present

## 2021-01-19 DIAGNOSIS — L570 Actinic keratosis: Secondary | ICD-10-CM | POA: Diagnosis not present

## 2021-01-19 DIAGNOSIS — B078 Other viral warts: Secondary | ICD-10-CM | POA: Diagnosis not present

## 2021-01-19 DIAGNOSIS — L578 Other skin changes due to chronic exposure to nonionizing radiation: Secondary | ICD-10-CM | POA: Diagnosis not present

## 2021-01-19 DIAGNOSIS — D225 Melanocytic nevi of trunk: Secondary | ICD-10-CM | POA: Diagnosis not present

## 2021-01-19 DIAGNOSIS — L282 Other prurigo: Secondary | ICD-10-CM | POA: Diagnosis not present

## 2021-02-08 ENCOUNTER — Other Ambulatory Visit: Payer: Self-pay | Admitting: Internal Medicine

## 2021-02-19 DIAGNOSIS — D122 Benign neoplasm of ascending colon: Secondary | ICD-10-CM | POA: Diagnosis not present

## 2021-02-19 DIAGNOSIS — K635 Polyp of colon: Secondary | ICD-10-CM | POA: Diagnosis not present

## 2021-02-19 DIAGNOSIS — Z1211 Encounter for screening for malignant neoplasm of colon: Secondary | ICD-10-CM | POA: Diagnosis not present

## 2021-02-19 DIAGNOSIS — D125 Benign neoplasm of sigmoid colon: Secondary | ICD-10-CM | POA: Diagnosis not present

## 2021-02-22 ENCOUNTER — Ambulatory Visit: Payer: BC Managed Care – PPO | Admitting: Internal Medicine

## 2021-02-24 DIAGNOSIS — E1169 Type 2 diabetes mellitus with other specified complication: Secondary | ICD-10-CM | POA: Diagnosis not present

## 2021-02-24 DIAGNOSIS — I10 Essential (primary) hypertension: Secondary | ICD-10-CM | POA: Diagnosis not present

## 2021-02-24 DIAGNOSIS — Z0000 Encounter for general adult medical examination without abnormal findings: Secondary | ICD-10-CM | POA: Diagnosis not present

## 2021-02-24 DIAGNOSIS — F339 Major depressive disorder, recurrent, unspecified: Secondary | ICD-10-CM | POA: Diagnosis not present

## 2021-02-24 DIAGNOSIS — E7800 Pure hypercholesterolemia, unspecified: Secondary | ICD-10-CM | POA: Diagnosis not present

## 2021-04-02 ENCOUNTER — Ambulatory Visit: Payer: BC Managed Care – PPO | Admitting: Internal Medicine

## 2021-04-02 ENCOUNTER — Encounter: Payer: Self-pay | Admitting: Internal Medicine

## 2021-04-02 ENCOUNTER — Other Ambulatory Visit: Payer: Self-pay

## 2021-04-02 VITALS — BP 150/92 | HR 83 | Ht 60.0 in | Wt 236.4 lb

## 2021-04-02 DIAGNOSIS — E1165 Type 2 diabetes mellitus with hyperglycemia: Secondary | ICD-10-CM

## 2021-04-02 DIAGNOSIS — Z794 Long term (current) use of insulin: Secondary | ICD-10-CM

## 2021-04-02 DIAGNOSIS — E785 Hyperlipidemia, unspecified: Secondary | ICD-10-CM | POA: Diagnosis not present

## 2021-04-02 DIAGNOSIS — E66813 Obesity, class 3: Secondary | ICD-10-CM

## 2021-04-02 LAB — POCT GLYCOSYLATED HEMOGLOBIN (HGB A1C): Hemoglobin A1C: 8 % — AB (ref 4.0–5.6)

## 2021-04-02 MED ORDER — FREESTYLE LIBRE 3 SENSOR MISC: 1.0000 | 3 refills | Status: DC

## 2021-04-02 NOTE — Progress Notes (Signed)
Patient ID: Colleen Henry, female   DOB: 04-04-1961, 60 y.o.   MRN: [id number]  This visit occurred during the SARS-CoV-2 public health emergency.  Safety protocols were in place, including screening questions prior to the visit, additional usage of staff PPE, and extensive cleaning of exam room while observing appropriate contact time as indicated for disinfecting solutions.   HPI: Colleen Henry is a 60 y.o.-year-old female, returning for f/u for DM2, dx in ~2011, insulin-dependent since 10/2019, uncontrolled, without complications. Last visit 4 months ago.  Interim history: No increased urination, blurry vision, nausea, chest pain.  She continues to have gas pains from Ozempic - Mylanta helps.  Reviewed HbA1c levels: Lab Results  Component Value Date   HGBA1C 9.0 (A) 11/24/2020   HGBA1C 12.0 (A) 09/22/2020   HGBA1C 8.0 (A) 03/31/2020  11/2014: HbA1c 9.9% 09/17/2014: HbA1c 12.2% 04/11/2014: HbA1c 8.6%  Pt was on a regimen of: - Metformin XR 1000 mg bid - cannot remember what happened with reg metformin - Farxiga 10 mg daily in am - started 07/2015 >> switched to Invokana but ran out  - Biocell: Biotin, Zinc, Chromium She stopped Amaryl - was taking 2 mg before b'fast Insulin was suggested to her >> too expensive 300$/mo  She is on: - Metformin ER 1000 mg 2x a day - Toujeo 14 units daily  - added 10/2019 >> off 08/2020 >> restarted at 20 units daily - Ozempic 0.25 mg weekly-added 08/2020 >> 0.5 mg weekly Refused Trulicity in the past, including 03/31/2020 Previously on glipizide 5 mg twice a day. Tyler Aas was not covered for her.  Pt checks her sugars 1x a day: - am:  250s-300s >> 113-157, 169, 183 >> 115-150, 162, 171 - 2h after b'fast: n/c - before lunch: n/c - 2h after lunch: 170 >> n/c >> 114 >> n/c  - before dinner: n/c >> 96 >> n/c - 2h after dinner: 205, 255, 297, 303 >> 134, 158 >> n/c - bedtime: n/c >> 97, 140s >> 232 >> n/c - nighttime: n/c >> 114 >> n/c Lowest  sugar was 46 >> 200 >> 113 >> 115; it is unclear at which CBG level she has hypoglycemia awareness. Highest sugar was 197 >> 380s >> 183 >> 171.  Glucometer: UltraTrak >> Easy Touch  -No CKD but she does have microalbuminuria, last BUN/creatinine:  02/24/2021: ACR 109.5 03/31/2020: ACR 128.8 12/26/2019: Glucose 135, BUN/creatinine 13/0.46, GFR 140 Lab Results  Component Value Date   BUN 12 02/28/2019   CREATININE 0.47 (L) 02/28/2019   Lab Results  Component Value Date   MICRALBCREAT 4.5 02/28/2019  10/06/2017: Glucose 126, BUN/creatinine 11/0.43, GFR 153, ACR 71.6 02/17/2016: ACR 66.8 07/31/2015: 14/0.41, eGFR 162 01/29/2015: ACR 31.6. On lisinopril.  -+ HL; last set of lipids: 02/24/2021: 216/75/64/139 12/26/2019: 216/88/62/139 Lab Results  Component Value Date   CHOL 203 (H) 02/28/2019   HDL 52.00 02/28/2019   LDLCALC 130 (H) 02/28/2019   TRIG 105.0 02/28/2019   CHOLHDL 4 02/28/2019  10/06/2017: 203/112/49/132 04/13/2017: 213/104/54/139 07/31/2015: 190/75/54/120 09/17/2014: 183/109/47/114 04/11/2014: 189/87/48/124  She refused statins.  - last eye exam was on 10/06/2020: + DR, was getting IO injections - not during the pandemic or now. Scheduled for next mo.  - + numbness and tingling in her feet.  ROS:  + See HPI  Past Medical History:  Diagnosis Date   Complication of anesthesia    agitation upon waking up as a child .    Depression    Diabetes mellitus  oral meds for diabetes . Type 2   Hypertension    Snores    Past Surgical History:  Procedure Laterality Date   BREAST CYST ASPIRATION     CESAREAN SECTION  01/16/1998   HERNIA REPAIR  2013   I & D EXTREMITY     INSERTION OF MESH  04/20/2012   Procedure: INSERTION OF MESH;  Surgeon: Adin Hector, MD;  Location: [place];  Service: General;  Laterality: N/A;   MASS EXCISION Right 04/19/2016   Procedure: RIGHT SMALL FINGER EXCISION MASS;  Surgeon: Daryll Brod, MD;  Location: [place];   Service: Orthopedics;  Laterality: Right;   TONSILLECTOMY AND ADENOIDECTOMY  age 38   VENTRAL HERNIA REPAIR  04/20/2012   Procedure: LAPAROSCOPIC VENTRAL HERNIA;  Surgeon: Adin Hector, MD;  Location: [place];  Service: General;  Laterality: N/A;  laparoscopic ventral wall hernia repair   VENTRAL HERNIA REPAIR  04/20/2012   Procedure: HERNIA REPAIR VENTRAL ADULT;  Surgeon: Adin Hector, MD;  Location: [place];  Service: General;  Laterality: N/A;  Repair incarcerated ventral hernia times two.   History   Social History   Marital Status: Married    Spouse Name: N/A   Number of Children: 1   Occupational History    Marketing executive   Social History Main Topics   Smoking status: Never Smoker    Smokeless tobacco: Never Used   Alcohol Use: Yes     Comment: occasionally - socially -   Wine or vodka once or twice a month , 2-3 drinks   Drug Use: No   Current Outpatient Medications on File Prior to Visit  Medication Sig Dispense Refill   escitalopram (LEXAPRO) 10 MG tablet Take 10 mg by mouth daily.     glipiZIDE (GLUCOTROL) 5 MG tablet TAKE 1 TABLET BY MOUTH TWICE DAILY WITH MEALS 180 tablet 3   insulin glargine, 2 Unit Dial, (TOUJEO MAX SOLOSTAR) 300 UNIT/ML Solostar Pen INJECT 14 UNITS INTO THE SKIN ONCE DAILYAT BEDTIME 6 mL 1   Insulin Pen Needle 32G X 4 MM MISC Use 1x a day 100 each 3   lisinopril-hydrochlorothiazide (PRINZIDE,ZESTORETIC) 20-25 MG tablet Take 1 tablet by mouth daily.     metFORMIN (GLUCOPHAGE-XR) 500 MG 24 hr tablet TAKE 2 TABLETS BY MOUTH TWICE DAILY WITHA MEAL 360 tablet 3   Semaglutide,0.25 or 0.5MG/DOS, (OZEMPIC, 0.25 OR 0.5 MG/DOSE,) 2 MG/1.5ML SOPN Inject 0.5 mg into the skin once a week. 4.5 mL 3   No current facility-administered medications on file prior to visit.   Allergies  Allergen Reactions   Penicillins Hives    Tolerates keflex   Family History  Problem Relation Age of Onset   Hypertension Mother    Hyperlipidemia Mother    Diabetes  Maternal Grandmother    Heart disease Maternal Grandfather    Hyperlipidemia Maternal Grandfather    PE: BP (!) 150/92 (BP Location: Right Arm, Patient Position: Sitting, Cuff Size: Normal)   Pulse 83   Ht 5' (1.524 m)   Wt 236 lb 6.4 oz (107.2 kg)   LMP 11/12/2015   SpO2 97%   BMI 46.17 kg/m  Body mass index is 46.17 kg/m. Wt Readings from Last 3 Encounters:  04/02/21 236 lb 6.4 oz (107.2 kg)  11/24/20 227 lb (103 kg)  09/22/20 234 lb 3.2 oz (106.2 kg)   Constitutional: overweight, in NAD Eyes: PERRLA, EOMI, no exophthalmos ENT: moist mucous membranes, no thyromegaly, no cervical lymphadenopathy Cardiovascular: RRR,  No MRG Respiratory: CTA B Gastrointestinal: abdomen soft, NT, ND, BS+ Musculoskeletal: no deformities, strength intact in all 4 Skin: moist, warm, no rashes Neurological: no tremor with outstretched hands, DTR normal in all 4  ASSESSMENT: 1. DM2,  insulin-dependent, uncontrolled, without long-term complications, but with hyperglycemia  No family history of medullary thyroid cancer or personal history of pancreatitis.  2. Obesity class 3  3. HL  PLAN:  1. Patient with longstanding, uncontrolled, type 2 diabetes, on metformin, long-acting insulin and now GLP-1 receptor agonist increased at last visit.  At last visit, sugars improved by almost 50%.  She was tolerating Ozempic well and started to lose weight on it.  However, she was using the lowest dose, 0.25 mg weekly as she misunderstood instructions.  I advised her to increase the dose as tolerated.  His HbA1c then was better, at 9.0%. -At today's visit, sugars are better, with only occasional hyperglycemic spikes in the morning after a larger dinner, but she still only checks in the morning.  We discussed about the importance of checking later in the day.  She would like to try a freestyle libre CGM.  I sent a prescription for the [place] [facility] to her pharmacy.  I believe that her sugars will improve once she has more  information about how they are changing throughout the day, after catching the CGM, but I did advise her that if they do not, especially with the holidays coming up, I advised her to increase the Toujeo dose.  At next visit, we may be able to increase Ozempic.  I would like to avoid doing this for now, since she still has some abdominal discomfort from this. - I suggested to:  Patient Instructions  Please continue: - Metformin ER 1000 mg 2x a day - Ozempic 0.5 mg weekly in a.m.   If sugars do not increase in next 2 weeks, increase: - Toujeo 24 units daily  Try to start the CGM.  Please return in 3-4 months.  - we checked her HbA1c: 8% (lower) - advised to check sugars at different times of the day - 4x a day, rotating check times - advised for yearly eye exams >> she is UTD - return to clinic in 3-4 months  2. Obesity class 3 -She initially refused a GLP-1 receptor agonist, but then accepted to try Ozempic.  I advised her to increase the dose at last visit. -She lost 7 pounds after starting Ozempic before last visit -Since last visit, she gained 9 pounds  3. HL -Reviewed latest data from 01/2021: LDL above target, the rest of the fractions at goal.  Triglycerides improved. -She continues to refuse statins -At this visit, we discussed about the benefits of statins and I also suggested to possibly get the calcium score checked.  If higher than 0, I would highly recommend to start a statin.  She agrees with this and will discuss with PCP about it.  Philemon Kingdom, MD PhD [facility]

## 2021-04-02 NOTE — Patient Instructions (Addendum)
Please continue: - Metformin ER 1000 mg 2x a day - Ozempic 0.5 mg weekly in a.m.   If sugars do not increase in next 2 weeks, increase: - Toujeo 24 units daily  Try to start the CGM.  Please return in 3-4 months.

## 2021-04-08 DIAGNOSIS — E119 Type 2 diabetes mellitus without complications: Secondary | ICD-10-CM | POA: Diagnosis not present

## 2021-06-29 DIAGNOSIS — Z13820 Encounter for screening for osteoporosis: Secondary | ICD-10-CM | POA: Diagnosis not present

## 2021-06-29 DIAGNOSIS — Z01419 Encounter for gynecological examination (general) (routine) without abnormal findings: Secondary | ICD-10-CM | POA: Diagnosis not present

## 2021-06-29 DIAGNOSIS — N912 Amenorrhea, unspecified: Secondary | ICD-10-CM | POA: Diagnosis not present

## 2021-06-29 DIAGNOSIS — Z6841 Body Mass Index (BMI) 40.0 and over, adult: Secondary | ICD-10-CM | POA: Diagnosis not present

## 2021-06-30 DIAGNOSIS — Z01419 Encounter for gynecological examination (general) (routine) without abnormal findings: Secondary | ICD-10-CM | POA: Diagnosis not present

## 2021-07-12 ENCOUNTER — Other Ambulatory Visit: Payer: Self-pay | Admitting: Internal Medicine

## 2021-07-13 ENCOUNTER — Ambulatory Visit (HOSPITAL_COMMUNITY)
Admission: EM | Admit: 2021-07-13 | Discharge: 2021-07-13 | Disposition: A | Discharge status: Home / Self Care | Payer: BC Managed Care – PPO | Attending: Family | Admitting: Family

## 2021-07-13 ENCOUNTER — Other Ambulatory Visit: Payer: Self-pay

## 2021-07-13 ENCOUNTER — Encounter (HOSPITAL_COMMUNITY): Payer: Self-pay | Admitting: Emergency Medicine

## 2021-07-13 ENCOUNTER — Emergency Department (HOSPITAL_COMMUNITY)
Admission: EM | Admit: 2021-07-13 | Discharge: 2021-07-14 | Disposition: A | Discharge status: Home / Self Care | Payer: BC Managed Care – PPO | Attending: Emergency Medicine | Admitting: Emergency Medicine

## 2021-07-13 DIAGNOSIS — Z79899 Other long term (current) drug therapy: Secondary | ICD-10-CM | POA: Insufficient documentation

## 2021-07-13 DIAGNOSIS — K760 Fatty (change of) liver, not elsewhere classified: Secondary | ICD-10-CM | POA: Diagnosis not present

## 2021-07-13 DIAGNOSIS — I10 Essential (primary) hypertension: Secondary | ICD-10-CM | POA: Diagnosis not present

## 2021-07-13 DIAGNOSIS — R109 Unspecified abdominal pain: Secondary | ICD-10-CM

## 2021-07-13 DIAGNOSIS — M546 Pain in thoracic spine: Secondary | ICD-10-CM | POA: Diagnosis not present

## 2021-07-13 DIAGNOSIS — Z7984 Long term (current) use of oral hypoglycemic drugs: Secondary | ICD-10-CM | POA: Diagnosis not present

## 2021-07-13 DIAGNOSIS — R1011 Right upper quadrant pain: Secondary | ICD-10-CM | POA: Diagnosis not present

## 2021-07-13 DIAGNOSIS — T148XXA Other injury of unspecified body region, initial encounter: Secondary | ICD-10-CM | POA: Diagnosis not present

## 2021-07-13 DIAGNOSIS — K769 Liver disease, unspecified: Secondary | ICD-10-CM | POA: Diagnosis not present

## 2021-07-13 DIAGNOSIS — E119 Type 2 diabetes mellitus without complications: Secondary | ICD-10-CM | POA: Diagnosis not present

## 2021-07-13 DIAGNOSIS — K7689 Other specified diseases of liver: Secondary | ICD-10-CM | POA: Diagnosis not present

## 2021-07-13 DIAGNOSIS — Z794 Long term (current) use of insulin: Secondary | ICD-10-CM | POA: Insufficient documentation

## 2021-07-13 LAB — URINALYSIS, ROUTINE W REFLEX MICROSCOPIC
Bilirubin Urine: NEGATIVE
Glucose, UA: NEGATIVE mg/dL
Hgb urine dipstick: NEGATIVE
Ketones, ur: NEGATIVE mg/dL
Leukocytes,Ua: NEGATIVE
Nitrite: NEGATIVE
Protein, ur: NEGATIVE mg/dL
Specific Gravity, Urine: 1.008 (ref 1.005–1.030)
pH: 7 (ref 5.0–8.0)

## 2021-07-13 MED ORDER — CYCLOBENZAPRINE HCL 10 MG PO TABS: ORAL_TABLET | ORAL | 0 refills | Status: DC

## 2021-07-13 MED ORDER — KETOROLAC TROMETHAMINE 60 MG/2ML IM SOLN
60.0000 mg | Freq: Once | INTRAMUSCULAR | Status: AC
  Administered 2021-07-13: 60 mg via INTRAMUSCULAR

## 2021-07-13 MED ORDER — DICLOFENAC SODIUM 75 MG PO TBEC: 75.0000 mg | DELAYED_RELEASE_TABLET | Freq: Two times a day (BID) | ORAL | 0 refills | Status: DC | PRN

## 2021-07-13 MED ORDER — KETOROLAC TROMETHAMINE 60 MG/2ML IM SOLN
INTRAMUSCULAR | Status: AC
Start: 2021-07-13 — End: ?
  Filled 2021-07-13: qty 2

## 2021-07-13 NOTE — Discharge Instructions (Addendum)
You were given a shot of Toradol 60mg  today to help with pain and inflammation in your back. Recommend start Diclofenac 75mg  every 12 hours as needed for pain- take with food to minimize stomach pain and risk of bleeding. May take Flexeril 10mg  tablets- take 1/2 to 1 whole tablet every 8 hours as needed for muscle pain/spasms. Continue to apply warm compresses to area for comfort. Follow-up in 3 to 4 days if not improving or go to the ER ASAP if symptoms worsen.

## 2021-07-13 NOTE — ED Triage Notes (Signed)
Pt c/o mid, right sided back pain x 3 days. States she was seen at [facility] [place] for same, has had minimal relief.

## 2021-07-13 NOTE — ED Provider Notes (Signed)
[place]    CSN: [id number] Arrival date & time: 07/13/21  0847      History   Chief Complaint Chief Complaint  Patient presents with   Back Pain    HPI Colleen Henry is a 61 y.o. female.   61 year old female presents with right mid back pain for the past 2 to 3 days.  No known injury but has been doing chair yoga in the past week.  Has been sitting for extended periods which seems to aggravate symptoms.  Woke up in pain 3 days ago and pain continues to get worse.  Now unable to sleep due to pain.  Has tried Tylenol and Advil with no relief.  Has had lower back pain and muscle spasms before but none in mid back.  No history of renal calculi. Has history of HTN and did not take her lisinopril/HCTZ yet this morning.  Other chronic health issues include type 2 DM and hyperlipidemia and currently on metformin, Toujeo, and Lexapro daily and Ozempic once weekly.  The history is provided by the patient.   Past Medical History:  Diagnosis Date   Complication of anesthesia    agitation upon waking up as a child .    Depression    Diabetes mellitus    oral meds for diabetes . Type 2   Hypertension    Snores     Patient Active Problem List   Diagnosis Date Noted   Hyperlipidemia 02/28/2019   Type 2 diabetes mellitus with hyperglycemia, without long-term current use of insulin ([place]) 08/26/2015   Ventral hernia x2 s/p lap [place] repair w mesh 04/20/2012 01/17/2012   HTN (hypertension) 01/17/2012   Depression 01/17/2012   Obesity, Class III, BMI 40-49.9 (morbid obesity) ([place]) 01/17/2012    Past Surgical History:  Procedure Laterality Date   BREAST CYST ASPIRATION     CESAREAN SECTION  01/16/1998   HERNIA REPAIR  2013   I & D EXTREMITY     INSERTION OF MESH  04/20/2012   Procedure: INSERTION OF MESH;  Surgeon: Adin Hector, MD;  Location: [facility];  Service: General;  Laterality: N/A;   MASS EXCISION Right 04/19/2016   Procedure: RIGHT SMALL FINGER EXCISION MASS;   Surgeon: Daryll Brod, MD;  Location: [facility];  Service: Orthopedics;  Laterality: Right;   TONSILLECTOMY AND ADENOIDECTOMY  age 89   VENTRAL HERNIA REPAIR  04/20/2012   Procedure: LAPAROSCOPIC VENTRAL HERNIA;  Surgeon: Adin Hector, MD;  Location: [facility];  Service: General;  Laterality: N/A;  laparoscopic ventral wall hernia repair   VENTRAL HERNIA REPAIR  04/20/2012   Procedure: HERNIA REPAIR VENTRAL ADULT;  Surgeon: Adin Hector, MD;  Location: [facility];  Service: General;  Laterality: N/A;  Repair incarcerated ventral hernia times two.    OB History   No obstetric history on file.      Home Medications    Prior to Admission medications   Medication Sig Start Date End Date Taking? Authorizing Provider  cyclobenzaprine (FLEXERIL) 10 MG tablet Take 1/2 to 1 whole tablet by mouth every 8 hours as needed for muscle spasms/pain. 07/13/21  Yes Demarlo Riojas, Nicholes Stairs, NP  diclofenac (VOLTAREN) 75 MG EC tablet Take 1 tablet (75 mg total) by mouth every 12 (twelve) hours as needed for moderate pain. 07/13/21  Yes Jaydynn Wolford, Nicholes Stairs, NP  Continuous Blood Gluc Sensor (FREESTYLE LIBRE 3 SENSOR) MISC 1 each by Does not apply route every 14 (fourteen) days.  04/02/21   Philemon Kingdom, MD  escitalopram (LEXAPRO) 10 MG tablet Take 10 mg by mouth daily. 03/09/20   [provider]  HYDROcodone-acetaminophen (NORCO/VICODIN) 5-325 MG tablet Take 2 tablets by mouth every 4 (four) hours as needed. 07/14/21   Larene Pickett, PA-C  Insulin Pen Needle 32G X 4 MM MISC Use 1x a day 11/08/19   Philemon Kingdom, MD  lidocaine (LIDODERM) 5 % Place 1 patch onto the skin daily. Remove & Discard patch within 12 hours or as directed by MD 07/14/21   Larene Pickett, PA-C  lisinopril-hydrochlorothiazide (PRINZIDE,ZESTORETIC) 20-25 MG tablet Take 1 tablet by mouth daily.    [provider]  metFORMIN (GLUCOPHAGE-XR) 500 MG 24 hr tablet TAKE 2 TABLETS BY MOUTH TWICE DAILY WITHA MEAL 05/06/20    Philemon Kingdom, MD  Semaglutide,0.25 or 0.5MG /DOS, (OZEMPIC, 0.25 OR 0.5 MG/DOSE,) 2 MG/1.5ML SOPN Inject 0.5 mg into the skin once a week. 09/22/20   Philemon Kingdom, MD  TOUJEO MAX SOLOSTAR 300 UNIT/ML Solostar Pen INJECT 14 UNITS INTO THE SKIN ONCE DAILYAT BEDTIME 07/12/21   Philemon Kingdom, MD    Family History Family History  Problem Relation Age of Onset   Hypertension Mother    Hyperlipidemia Mother    Diabetes Maternal Grandmother    Heart disease Maternal Grandfather    Hyperlipidemia Maternal Grandfather     Social History Social History   Tobacco Use   Smoking status: Never   Smokeless tobacco: Never  Vaping Use   Vaping Use: Never used  Substance Use Topics   Alcohol use: Yes    Comment: occasionally - socially   Drug use: No     Allergies   Penicillins   Review of Systems Review of Systems  Constitutional:  Positive for activity change. Negative for chills, diaphoresis, fatigue and fever.  HENT:  Negative for sore throat and trouble swallowing.   Eyes:  Negative for photophobia and visual disturbance.  Respiratory:  Negative for cough, chest tightness and shortness of breath.   Gastrointestinal:  Negative for nausea and vomiting.  Musculoskeletal:  Positive for back pain and myalgias. Negative for neck pain and neck stiffness.  Skin:  Negative for color change and rash.  Allergic/Immunologic: Negative for environmental allergies, food allergies and immunocompromised state.  Neurological:  Negative for dizziness, tremors, seizures, syncope, weakness, light-headedness, numbness and headaches.  Hematological:  Negative for adenopathy. Does not bruise/bleed easily.  Psychiatric/Behavioral:  Positive for sleep disturbance.     Physical Exam Triage Vital Signs ED Triage Vitals  Enc Vitals Group     BP 07/13/21 0902 (!) 182/100     Pulse Rate 07/13/21 0902 73     Resp 07/13/21 0902 20     Temp 07/13/21 0902 98.7 F (37.1 C)     Temp Source 07/13/21  0902 Oral     SpO2 07/13/21 0902 96 %     Weight --      Height --      Head Circumference --      Peak Flow --      Pain Score 07/13/21 0859 8     Pain Loc --      Pain Edu? --      Excl. in [place]? --    No data found.  Updated Vital Signs BP (!) 182/100 (BP Location: Left Arm) Comment: has not had medicines today or eaten Comment (BP Location): large cuff   Pulse 73    Temp 98.7 F (37.1 C) (Oral)  Resp 20    LMP 11/12/2015    SpO2 96%   Visual Acuity Right Eye Distance:   Left Eye Distance:   Bilateral Distance:    Right Eye Near:   Left Eye Near:    Bilateral Near:     Physical Exam Vitals and nursing note reviewed.  Constitutional:      General: She is awake. She is not in acute distress.    Appearance: She is well-developed, well-groomed and overweight.     Comments: She is standing in the exam room and continues to move around to try to find a comfortable position. Able to sit without difficulty but in pain.   HENT:     Head: Normocephalic and atraumatic.     Right Ear: Hearing normal.     Left Ear: Hearing normal.  Cardiovascular:     Rate and Rhythm: Normal rate and regular rhythm.     Heart sounds: Normal heart sounds. No murmur heard. Pulmonary:     Effort: Pulmonary effort is normal. No respiratory distress.     Breath sounds: Normal breath sounds and air entry. No decreased air movement. No decreased breath sounds, wheezing, rhonchi or rales.  Musculoskeletal:        General: Tenderness present. No deformity.     Cervical back: Normal, normal range of motion and neck supple.     Thoracic back: Tenderness present. No edema or signs of trauma. Decreased range of motion. No scoliosis.     Lumbar back: Normal.       Back:     Comments: Right mid to upper thoracic tenderness present. No rash or redness. Muscles are tight but no distinct spasms present. Has full range of motion of lumbar area. No neuro deficits noted.   Skin:    General: Skin is warm and dry.      Capillary Refill: Capillary refill takes less than 2 seconds.     Findings: No erythema or rash.  Neurological:     General: No focal deficit present.     Mental Status: She is alert and oriented to person, place, and time.     Sensory: Sensation is intact. No sensory deficit.     Motor: Motor function is intact.     Gait: Gait is intact.     Deep Tendon Reflexes: Reflexes are normal and symmetric.  Psychiatric:        Mood and Affect: Mood normal.        Behavior: Behavior normal. Behavior is cooperative.        Thought Content: Thought content normal.        Judgment: Judgment normal.     UC Treatments / Results  Labs (all labs ordered are listed, but only abnormal results are displayed) Labs Reviewed - No data to display  EKG   Radiology CT ABDOMEN PELVIS W CONTRAST  Result Date: 07/14/2021 CLINICAL DATA:  61 year old female with acute abdominal pain. Right upper quadrant pain with indeterminate right lobe liver lesion on ultrasound this morning. EXAM: CT ABDOMEN AND PELVIS WITH CONTRAST TECHNIQUE: Multidetector CT imaging of the abdomen and pelvis was performed using the standard protocol following bolus administration of intravenous contrast. RADIATION DOSE REDUCTION: This exam was performed according to the departmental dose-optimization program which includes automated exposure control, adjustment of the mA and/or kV according to patient size and/or use of iterative reconstruction technique. CONTRAST:  69mL OMNIPAQUE IOHEXOL 300 MG/ML  SOLN COMPARISON:  Right upper quadrant ultrasound 0147 hours. CT Abdomen and  Pelvis 04/24/2019. FINDINGS: Lower chest: Mild to moderate cardiomegaly appears somewhat increased since 2020. No associated pericardial or pleural effusion. Stable mild lung base atelectasis or scarring. Hepatobiliary: Round 2.3 cm central hepatic cyst is stable since 2020 and appears benign. The right lobe hypoechoic area by ultrasound adjacent to the gallbladder  fossa is poorly visible by CT and cannot be delineated. Background liver enhancement appears within normal limits. Negative gallbladder. No bile duct enlargement. Pancreas: Negative. Spleen: Stable and negative.  Incidental splenule (normal variant). Adrenals/Urinary Tract: Stable nodular appearance of the left adrenal gland since 2020, measuring up to 15 mm on series 3, image 30 compatible with benign adrenal adenoma. Normal right adrenal. Nonobstructed kidneys with symmetric renal enhancement and contrast excretion. Normal ureters. No nephrolithiasis or pararenal inflammation. Chronic pelvic phleboliths. No convincing distal ureteral calculus. Unremarkable bladder. Stomach/Bowel: Redundant but mostly decompressed large bowel. Retained stool at the hepatic flexure. Normal appendix lateral to the cecum on series 3, image 52. Cecum is on a mildly lax mesentery. Negative terminal ileum. No dilated small bowel. Small duodenal diverticulum in the 2nd portion measuring 19 mm contains gas and is chronic. No active inflammation. No free air, free fluid, mesenteric stranding identified. Vascular/Lymphatic: Suboptimal intravascular contrast bolus but the major arterial structures and portal venous system appear patent. No atherosclerosis identified. Mildly tortuous aorta and iliac arteries. No lymphadenopathy identified. Reproductive: Negative. Other: No pelvic free fluid. Musculoskeletal: Bulky chronic lower thoracic degenerative endplate spurring. Chronic L4-L5 grade 1 spondylolisthesis with advanced lumbar facet arthropathy. Benign L5 vertebral body hemangioma appears stable. Chronic SI joint degeneration. No acute or suspicious osseous lesion. IMPRESSION: 1. The right hepatic lobe hypoechoic ultrasound lesion is not delineated by CT. Recommend nonemergent follow-up Abdomen MRI (liver protocol without and with contrast) to further characterize. 2. No superimposed acute or inflammatory process identified in the abdomen or  pelvis. Stable benign left adrenal adenoma and central Hepatic cyst since 2020. 3. Cardiomegaly appears increased since 2020. Electronically Signed   By: Genevie Reveca Desmarais M.D.   On: 07/14/2021 04:26   US Abdomen Limited RUQ (LIVER/GB)  Result Date: 07/14/2021 CLINICAL DATA:  Abdominal pain. EXAM: ULTRASOUND ABDOMEN LIMITED RIGHT UPPER QUADRANT COMPARISON:  04/23/2021. FINDINGS: Gallbladder: No gallstones or wall thickening visualized. No sonographic Murphy sign noted by sonographer. Common bile duct: Diameter: 3.7 mm. Liver: There is a hypoechoic region in the posterior right lobe of the liver measuring 2.9 x 2.7 x 4.0 cm. A cyst is noted in the left lobe of the liver measuring 2.3 x 1.7 x 2.5 cm. Increased parenchymal echogenicity. Portal vein is patent on color Doppler imaging with normal direction of blood flow towards the liver. Other: No free fluid. IMPRESSION: 1. No cholelithiasis or acute cholecystitis. 2. Hepatic steatosis and hepatic cyst. There is a hypoechoic region in the right lobe of the liver which is indeterminate. Findings may represent focal fatty sparing or other abnormality. Multiphase CT or MRI may be beneficial for further characterization. Electronically Signed   By: Brett Fairy M.D.   On: 07/14/2021 02:18    Procedures Procedures (including critical care time)  Medications Ordered in UC Medications  ketorolac (TORADOL) injection 60 mg (60 mg Intramuscular Given 07/13/21 1023)    Initial Impression / Assessment and Plan / UC Course  I have reviewed the triage vital signs and the nursing notes.  Pertinent labs & imaging results that were available during my care of the patient were reviewed by me and considered in my medical decision making (see chart  for details).     Reviewed with patient that she may have a muscle strain but discussed possibility of renal calculi. Patient desires treatment for pain and musculoskeletal etiology and declines urine testing at this time. Gave  Toradol 60mg  IM now to help with pain. May take Voltaren 75mg  every 12 hours as needed for pain. May use Flexeril 10mg  1/2 to 1 whole tablet every 8 hours as needed for muscle pain/spasms.  Continue to apply warm compresses to area for comfort.  Follow-up in 3 to 4 days if not improving or go to the ER ASAP if symptoms worsen. Final Clinical Impressions(s) / UC Diagnoses   Final diagnoses:  Acute right-sided thoracic back pain  Muscle strain     Discharge Instructions      You were given a shot of Toradol 60mg  today to help with pain and inflammation in your back. Recommend start Diclofenac 75mg  every 12 hours as needed for pain- take with food to minimize stomach pain and risk of bleeding. May take Flexeril 10mg  tablets- take 1/2 to 1 whole tablet every 8 hours as needed for muscle pain/spasms. Continue to apply warm compresses to area for comfort. Follow-up in 3 to 4 days if not improving or go to the ER ASAP if symptoms worsen.     ED Prescriptions     Medication Sig Dispense Auth. Provider   diclofenac (VOLTAREN) 75 MG EC tablet Take 1 tablet (75 mg total) by mouth every 12 (twelve) hours as needed for moderate pain. 20 tablet Katy Apo, NP   cyclobenzaprine (FLEXERIL) 10 MG tablet Take 1/2 to 1 whole tablet by mouth every 8 hours as needed for muscle spasms/pain. 15 tablet Ericca Labra, Nicholes Stairs, NP      PDMP not reviewed this encounter.   Katy Apo, NP 07/14/21 1439

## 2021-07-13 NOTE — ED Provider Triage Note (Signed)
Emergency Medicine Provider Triage Evaluation Note  Colleen Henry , a 61 y.o. female  was evaluated in triage.  Pt complains of right-sided back pain.  Started after doing anterior class on Friday, was seen in urgent care earlier today who gave her a shot of Toradol and discharged with anti-inflammatory medicine.  Patient states has not improved her pain, she does have some nausea, vomited yesterday.  No dysuria or hematuria, no history of kidney stones..  Review of Systems  Positive: above Negative: above  Physical Exam  LMP 11/12/2015  Gen:   Awake, no distress   Resp:  Normal effort  MSK:   Moves extremities without difficulty  Other:  Ambulatory, able to base with lower extremities out difficulty.  Pain is primarily to the right subscapular area.  No CVA tenderness.  Medical Decision Making  Medically screening exam initiated at 8:09 PM.  Appropriate orders placed.  Tally Joe was informed that the remainder of the evaluation will be completed by another provider, this initial triage assessment does not replace that evaluation, and the importance of remaining in the ED until their evaluation is complete.  We will check urine.  Suspect muscle.  No chest pain or shortness of breath, doubt PE.     Sherrill Raring, PA-C 07/13/21 2010

## 2021-07-13 NOTE — ED Triage Notes (Addendum)
Right , mid back pain that started Sunday, 07/11/2021.  No known injury.  Patient recently started yoga.  Pain changes with movement

## 2021-07-14 ENCOUNTER — Emergency Department (HOSPITAL_COMMUNITY): Payer: BC Managed Care – PPO

## 2021-07-14 DIAGNOSIS — M546 Pain in thoracic spine: Secondary | ICD-10-CM | POA: Diagnosis not present

## 2021-07-14 DIAGNOSIS — K7689 Other specified diseases of liver: Secondary | ICD-10-CM | POA: Diagnosis not present

## 2021-07-14 DIAGNOSIS — K760 Fatty (change of) liver, not elsewhere classified: Secondary | ICD-10-CM | POA: Diagnosis not present

## 2021-07-14 DIAGNOSIS — R109 Unspecified abdominal pain: Secondary | ICD-10-CM | POA: Diagnosis not present

## 2021-07-14 DIAGNOSIS — K769 Liver disease, unspecified: Secondary | ICD-10-CM | POA: Diagnosis not present

## 2021-07-14 DIAGNOSIS — R1011 Right upper quadrant pain: Secondary | ICD-10-CM | POA: Diagnosis not present

## 2021-07-14 LAB — CBC WITH DIFFERENTIAL/PLATELET
Abs Immature Granulocytes: 0.03 10*3/uL (ref 0.00–0.07)
Basophils Absolute: 0 10*3/uL (ref 0.0–0.1)
Basophils Relative: 1 %
Eosinophils Absolute: 0.1 10*3/uL (ref 0.0–0.5)
Eosinophils Relative: 1 %
HCT: 43.1 % (ref 36.0–46.0)
Hemoglobin: 14.2 g/dL (ref 12.0–15.0)
Immature Granulocytes: 0 %
Lymphocytes Relative: 22 %
Lymphs Abs: 1.9 10*3/uL (ref 0.7–4.0)
MCH: 29.5 pg (ref 26.0–34.0)
MCHC: 32.9 g/dL (ref 30.0–36.0)
MCV: 89.4 fL (ref 80.0–100.0)
Monocytes Absolute: 0.3 10*3/uL (ref 0.1–1.0)
Monocytes Relative: 4 %
Neutro Abs: 6.4 10*3/uL (ref 1.7–7.7)
Neutrophils Relative %: 72 %
Platelets: 294 10*3/uL (ref 150–400)
RBC: 4.82 MIL/uL (ref 3.87–5.11)
RDW: 13 % (ref 11.5–15.5)
WBC: 8.8 10*3/uL (ref 4.0–10.5)
nRBC: 0 % (ref 0.0–0.2)

## 2021-07-14 LAB — COMPREHENSIVE METABOLIC PANEL
ALT: 15 U/L (ref 0–44)
AST: 14 U/L — ABNORMAL LOW (ref 15–41)
Albumin: 3.9 g/dL (ref 3.5–5.0)
Alkaline Phosphatase: 81 U/L (ref 38–126)
Anion gap: 9 (ref 5–15)
BUN: 10 mg/dL (ref 6–20)
CO2: 29 mmol/L (ref 22–32)
Calcium: 9.1 mg/dL (ref 8.9–10.3)
Chloride: 99 mmol/L (ref 98–111)
Creatinine, Ser: 0.45 mg/dL (ref 0.44–1.00)
GFR, Estimated: >60 mL/min (ref >60)
Glucose, Bld: 194 mg/dL — ABNORMAL HIGH (ref 70–99)
Potassium: 3.6 mmol/L (ref 3.5–5.1)
Sodium: 137 mmol/L (ref 135–145)
Total Bilirubin: 0.8 mg/dL (ref 0.3–1.2)
Total Protein: 7.3 g/dL (ref 6.5–8.1)

## 2021-07-14 LAB — LIPASE, BLOOD: Lipase: 23 U/L (ref 11–51)

## 2021-07-14 MED ORDER — HYDROCODONE-ACETAMINOPHEN 5-325 MG PO TABS: 2.0000 | ORAL_TABLET | ORAL | 0 refills | Status: DC | PRN

## 2021-07-14 MED ORDER — LIDOCAINE 5 % EX PTCH: 1.0000 | MEDICATED_PATCH | CUTANEOUS | 0 refills | Status: DC

## 2021-07-14 MED ORDER — LIDOCAINE 5 % EX PTCH
1.0000 | MEDICATED_PATCH | CUTANEOUS | Status: DC
  Administered 2021-07-14: 1 via TRANSDERMAL
  Filled 2021-07-14: qty 1

## 2021-07-14 MED ORDER — IOHEXOL 300 MG/ML  SOLN
80.0000 mL | Freq: Once | INTRAMUSCULAR | Status: AC | PRN
  Administered 2021-07-14: 80 mL via INTRAVENOUS

## 2021-07-14 MED ORDER — HYDROCODONE-ACETAMINOPHEN 5-325 MG PO TABS
1.0000 | ORAL_TABLET | Freq: Once | ORAL | Status: AC
Start: 2021-07-14 — End: 2021-07-14
  Administered 2021-07-14: 1 via ORAL
  Filled 2021-07-14: qty 1

## 2021-07-14 NOTE — ED Notes (Signed)
Provider at bedside

## 2021-07-14 NOTE — Discharge Instructions (Signed)
As we discussed, area on right side of liver still not well defined on CT and radiology has recommended OP MRI.  I would call Dr. Collene Mares to have this arranged. Take the prescribed medication as directed. Follow-up with your primary care doctor. Return to the ED for new or worsening symptoms.

## 2021-07-14 NOTE — ED Provider Notes (Signed)
[facility] EMERGENCY DEPARTMENT Provider Note   CSN: [id number] Arrival date & time: 07/13/21  1951     History  Chief Complaint  Patient presents with   Back Pain    Colleen Henry is a 61 y.o. female.  The history is provided by the patient and medical records.  Back Pain Associated symptoms: abdominal pain    61 year old female with history of hypertension, depression, diabetes, presenting to the ED with right-sided back pain.  States this started a few days ago.  She was seen at urgent care yesterday morning for same, given Toradol and discharged with diclofenac and Flexeril.  States medications do help but only last for short period of time and then pain comes back.  States now pain seems to be wrapping around to the right side of her abdomen.  She has not had any nausea or vomiting.  She was concerned about possible gallbladder etiology.  She has had prior hernia repair in the past.  Home Medications Prior to Admission medications   Medication Sig Start Date End Date Taking? Authorizing Provider  HYDROcodone-acetaminophen (NORCO/VICODIN) 5-325 MG tablet Take 2 tablets by mouth every 4 (four) hours as needed. 07/14/21  Yes Larene Pickett, PA-C  lidocaine (LIDODERM) 5 % Place 1 patch onto the skin daily. Remove & Discard patch within 12 hours or as directed by MD 07/14/21  Yes Larene Pickett, PA-C  Continuous Blood Gluc Sensor (FREESTYLE LIBRE 3 SENSOR) MISC 1 each by Does not apply route every 14 (fourteen) days. 04/02/21   Philemon Kingdom, MD  cyclobenzaprine (FLEXERIL) 10 MG tablet Take 1/2 to 1 whole tablet by mouth every 8 hours as needed for muscle spasms/pain. 07/13/21   Katy Apo, NP  diclofenac (VOLTAREN) 75 MG EC tablet Take 1 tablet (75 mg total) by mouth every 12 (twelve) hours as needed for moderate pain. 07/13/21   Katy Apo, NP  escitalopram (LEXAPRO) 10 MG tablet Take 10 mg by mouth daily. 03/09/20   [provider]   Insulin Pen Needle 32G X 4 MM MISC Use 1x a day 11/08/19   Philemon Kingdom, MD  lisinopril-hydrochlorothiazide (PRINZIDE,ZESTORETIC) 20-25 MG tablet Take 1 tablet by mouth daily.    [provider]  metFORMIN (GLUCOPHAGE-XR) 500 MG 24 hr tablet TAKE 2 TABLETS BY MOUTH TWICE DAILY WITHA MEAL 05/06/20   Philemon Kingdom, MD  Semaglutide,0.25 or 0.5MG /DOS, (OZEMPIC, 0.25 OR 0.5 MG/DOSE,) 2 MG/1.5ML SOPN Inject 0.5 mg into the skin once a week. 09/22/20   Philemon Kingdom, MD  TOUJEO MAX SOLOSTAR 300 UNIT/ML Solostar Pen INJECT 14 UNITS INTO THE SKIN ONCE DAILYAT BEDTIME 07/12/21   Philemon Kingdom, MD      Allergies    Penicillins    Review of Systems   Review of Systems  Gastrointestinal:  Positive for abdominal pain.  Musculoskeletal:  Positive for back pain.  All other systems reviewed and are negative.  Physical Exam Updated Vital Signs BP (!) 189/79    Pulse 69    Temp 98.9 F (37.2 C) (Oral)    Resp 17    Ht 5' (1.524 m)    Wt 104.8 kg    LMP 11/12/2015    SpO2 95%    BMI 45.11 kg/m   Physical Exam Vitals and nursing note reviewed.  Constitutional:      Appearance: She is well-developed.  HENT:     Head: Normocephalic and atraumatic.  Eyes:     Conjunctiva/sclera: Conjunctivae normal.  Pupils: Pupils are equal, round, and reactive to light.  Cardiovascular:     Rate and Rhythm: Normal rate and regular rhythm.     Heart sounds: Normal heart sounds.  Pulmonary:     Effort: Pulmonary effort is normal.     Breath sounds: Normal breath sounds.  Abdominal:     General: Bowel sounds are normal.     Palpations: Abdomen is soft.     Tenderness: There is abdominal tenderness.     Comments: Very minimal tenderness to RUQ, no murphy's sign  Musculoskeletal:        General: Normal range of motion.     Cervical back: Normal range of motion.     Comments: Right flank is tender to palpation-- endorses radiation to RUQ; there is no noted rash to suggest shingles   Skin:    General: Skin is warm and dry.  Neurological:     Mental Status: She is alert and oriented to person, place, and time.    ED Results / Procedures / Treatments   Labs (all labs ordered are listed, but only abnormal results are displayed) Labs Reviewed  URINALYSIS, ROUTINE W REFLEX MICROSCOPIC - Abnormal; Notable for the following components:      Result Value   Color, Urine STRAW (*)    All other components within normal limits  COMPREHENSIVE METABOLIC PANEL - Abnormal; Notable for the following components:   Glucose, Bld 194 (*)    AST 14 (*)    All other components within normal limits  URINE CULTURE  CBC WITH DIFFERENTIAL/PLATELET  LIPASE, BLOOD    EKG None  Radiology CT ABDOMEN PELVIS W CONTRAST  Result Date: 07/14/2021 CLINICAL DATA:  61 year old female with acute abdominal pain. Right upper quadrant pain with indeterminate right lobe liver lesion on ultrasound this morning. EXAM: CT ABDOMEN AND PELVIS WITH CONTRAST TECHNIQUE: Multidetector CT imaging of the abdomen and pelvis was performed using the standard protocol following bolus administration of intravenous contrast. RADIATION DOSE REDUCTION: This exam was performed according to the departmental dose-optimization program which includes automated exposure control, adjustment of the mA and/or kV according to patient size and/or use of iterative reconstruction technique. CONTRAST:  43mL OMNIPAQUE IOHEXOL 300 MG/ML  SOLN COMPARISON:  Right upper quadrant ultrasound 0147 hours. CT Abdomen and Pelvis 04/24/2019. FINDINGS: Lower chest: Mild to moderate cardiomegaly appears somewhat increased since 2020. No associated pericardial or pleural effusion. Stable mild lung base atelectasis or scarring. Hepatobiliary: Round 2.3 cm central hepatic cyst is stable since 2020 and appears benign. The right lobe hypoechoic area by ultrasound adjacent to the gallbladder fossa is poorly visible by CT and cannot be delineated. Background  liver enhancement appears within normal limits. Negative gallbladder. No bile duct enlargement. Pancreas: Negative. Spleen: Stable and negative.  Incidental splenule (normal variant). Adrenals/Urinary Tract: Stable nodular appearance of the left adrenal gland since 2020, measuring up to 15 mm on series 3, image 30 compatible with benign adrenal adenoma. Normal right adrenal. Nonobstructed kidneys with symmetric renal enhancement and contrast excretion. Normal ureters. No nephrolithiasis or pararenal inflammation. Chronic pelvic phleboliths. No convincing distal ureteral calculus. Unremarkable bladder. Stomach/Bowel: Redundant but mostly decompressed large bowel. Retained stool at the hepatic flexure. Normal appendix lateral to the cecum on series 3, image 52. Cecum is on a mildly lax mesentery. Negative terminal ileum. No dilated small bowel. Small duodenal diverticulum in the 2nd portion measuring 19 mm contains gas and is chronic. No active inflammation. No free air, free fluid, mesenteric stranding identified.  Vascular/Lymphatic: Suboptimal intravascular contrast bolus but the major arterial structures and portal venous system appear patent. No atherosclerosis identified. Mildly tortuous aorta and iliac arteries. No lymphadenopathy identified. Reproductive: Negative. Other: No pelvic free fluid. Musculoskeletal: Bulky chronic lower thoracic degenerative endplate spurring. Chronic L4-L5 grade 1 spondylolisthesis with advanced lumbar facet arthropathy. Benign L5 vertebral body hemangioma appears stable. Chronic SI joint degeneration. No acute or suspicious osseous lesion. IMPRESSION: 1. The right hepatic lobe hypoechoic ultrasound lesion is not delineated by CT. Recommend nonemergent follow-up Abdomen MRI (liver protocol without and with contrast) to further characterize. 2. No superimposed acute or inflammatory process identified in the abdomen or pelvis. Stable benign left adrenal adenoma and central Hepatic cyst  since 2020. 3. Cardiomegaly appears increased since 2020. Electronically Signed   By: Genevie Ann M.D.   On: 07/14/2021 04:26   US Abdomen Limited RUQ (LIVER/GB)  Result Date: 07/14/2021 CLINICAL DATA:  Abdominal pain. EXAM: ULTRASOUND ABDOMEN LIMITED RIGHT UPPER QUADRANT COMPARISON:  04/23/2021. FINDINGS: Gallbladder: No gallstones or wall thickening visualized. No sonographic Murphy sign noted by sonographer. Common bile duct: Diameter: 3.7 mm. Liver: There is a hypoechoic region in the posterior right lobe of the liver measuring 2.9 x 2.7 x 4.0 cm. A cyst is noted in the left lobe of the liver measuring 2.3 x 1.7 x 2.5 cm. Increased parenchymal echogenicity. Portal vein is patent on color Doppler imaging with normal direction of blood flow towards the liver. Other: No free fluid. IMPRESSION: 1. No cholelithiasis or acute cholecystitis. 2. Hepatic steatosis and hepatic cyst. There is a hypoechoic region in the right lobe of the liver which is indeterminate. Findings may represent focal fatty sparing or other abnormality. Multiphase CT or MRI may be beneficial for further characterization. Electronically Signed   By: Brett Fairy M.D.   On: 07/14/2021 02:18    Procedures Procedures    Medications Ordered in ED Medications  lidocaine (LIDODERM) 5 % 1 patch (1 patch Transdermal Patch Applied 07/14/21 0326)  HYDROcodone-acetaminophen (NORCO/VICODIN) 5-325 MG per tablet 1 tablet (1 tablet Oral Given 07/14/21 0326)  iohexol (OMNIPAQUE) 300 MG/ML solution 80 mL (80 mLs Intravenous Contrast Given 07/14/21 0411)    ED Course/ Medical Decision Making/ A&P                           Medical Decision Making Amount and/or Complexity of Data Reviewed Labs: ordered. Radiology: ordered and independent interpretation performed. ECG/medicine tests: ordered and independent interpretation performed.  Risk Prescription drug management.   61 y.o. F presenting to the ED with right sided back pain.  Seen at urgent  care yesterday morning for same, transient relief with medications.  States now pain is wrapping around a little bit to her right abdomen and was concerned for possible gallbladder etiology.  She is afebrile and nontoxic in appearance here.  She does have some mild tenderness to the thoracic musculature and into the right upper quadrant but no Murphy sign.  There is no rash suggestive of shingles or other overlying skin abnormalities.  Her labs are overall reassuring.  Ultrasound was obtained from triage, hypoechoic findings in the right lobe of liver.  Recommended CT for further evaluation.  This has been ordered.  Patient given additional medications for pain control.  CT with still undefined hypoechoic area in the right lobe of liver.  No other acute findings.  Radiology recommends nonemergent abdominal MRI.  After additional medications, she is much more comfortable, states  Lidoderm patch has helped tremendously.  We have discussed CT findings.  She does have GI physician that she follows with regularly, Dr. Collene Mares.  Discussed with patient that I do not think these liver findings are etiology of her pain, it seems likely musculoskeletal, however this will need to be monitored since they were seen today.  She will call Dr. Collene Mares for follow-up, have also given her print outs of CT results for physician review.  Plan to d/c home with hydrocodone and lidoderm patches.  Can follow-up with PCP in the interim.  Return here for any new/acute changes.  Final Clinical Impression(s) / ED Diagnoses Final diagnoses:  Abdominal pain  Acute right-sided thoracic back pain    Rx / DC Orders ED Discharge Orders          Ordered    lidocaine (LIDODERM) 5 %  Every 24 hours        07/14/21 0535    HYDROcodone-acetaminophen (NORCO/VICODIN) 5-325 MG tablet  Every 4 hours PRN        07/14/21 0535              Larene Pickett, PA-C 07/14/21 4035    Quintella Reichert, MD 07/14/21 [phone]

## 2021-07-14 NOTE — ED Provider Notes (Signed)
Brought patient back to triage for re-assessment.  She is a 61 yo female who reports she has had 3 days of right mid back pain, it is constant, now radiates into the RUQ of the abdomen, and is improved a mild degree with medications prescribed @ UC (diclofenac/robaxin). She has had some associated nausea w/ 1 episode of emesis. She is concerned about her gallbladder. She mentions doing chair yoga, however has been doing this for 3 weeks.   On exam she does have right upper lumbar/lower thoracic lumbar paraspinal muscle TTP. No midline tenderness. She is ambulatory. She also has epigastric abdominal TTP as well as some RUQ TTP- very mild.   High suspicion for MSK pain, however given her abdominal tenderness w/ vomiting x 1, will obtain labs & RUQ [place]- patient in agreement with this.      Leafy Kindle 07/14/21 5800    Quintella Reichert, MD 07/14/21 [phone]

## 2021-07-14 NOTE — ED Notes (Signed)
Transported to ct scan 

## 2021-07-15 LAB — URINE CULTURE: Culture: <10000 — AB

## 2021-07-29 DIAGNOSIS — Z86010 Personal history of colonic polyps: Secondary | ICD-10-CM | POA: Diagnosis not present

## 2021-07-29 DIAGNOSIS — R933 Abnormal findings on diagnostic imaging of other parts of digestive tract: Secondary | ICD-10-CM | POA: Diagnosis not present

## 2021-07-29 DIAGNOSIS — K760 Fatty (change of) liver, not elsewhere classified: Secondary | ICD-10-CM | POA: Diagnosis not present

## 2021-08-03 ENCOUNTER — Other Ambulatory Visit: Payer: Self-pay | Admitting: Gastroenterology

## 2021-08-03 DIAGNOSIS — R9389 Abnormal findings on diagnostic imaging of other specified body structures: Secondary | ICD-10-CM

## 2021-08-06 ENCOUNTER — Ambulatory Visit: Payer: BC Managed Care – PPO | Admitting: Internal Medicine

## 2021-08-12 ENCOUNTER — Ambulatory Visit (INDEPENDENT_AMBULATORY_CARE_PROVIDER_SITE_OTHER): Payer: BC Managed Care – PPO | Admitting: Internal Medicine

## 2021-08-12 ENCOUNTER — Other Ambulatory Visit: Payer: Self-pay

## 2021-08-12 ENCOUNTER — Other Ambulatory Visit: Payer: Self-pay | Admitting: Gastroenterology

## 2021-08-12 ENCOUNTER — Encounter: Payer: Self-pay | Admitting: Internal Medicine

## 2021-08-12 VITALS — BP 144/100 | HR 92 | Ht 60.0 in | Wt 230.8 lb

## 2021-08-12 DIAGNOSIS — E785 Hyperlipidemia, unspecified: Secondary | ICD-10-CM

## 2021-08-12 DIAGNOSIS — E1165 Type 2 diabetes mellitus with hyperglycemia: Secondary | ICD-10-CM

## 2021-08-12 DIAGNOSIS — Z794 Long term (current) use of insulin: Secondary | ICD-10-CM

## 2021-08-12 LAB — POCT GLYCOSYLATED HEMOGLOBIN (HGB A1C): Hemoglobin A1C: 8.3 % — AB (ref 4.0–5.6)

## 2021-08-12 NOTE — Patient Instructions (Signed)
Please increase: ?- Metformin ER 1000 mg 2x a day ? ?Please continue: ?- Ozempic 0.5 mg weekly in a.m.  ? ?If sugars do not increase in next 2 weeks, increase: ?- Toujeo 24 units daily ? ?Try to start the CGM. ? ?Please return in 3 months. ?

## 2021-08-12 NOTE — Progress Notes (Signed)
Patient ID: MYSTIC LABO, female   DOB: 26-Nov-1960, 61 y.o.   MRN: [id number] ? ?This visit occurred during the SARS-CoV-2 public health emergency.  Safety protocols were in place, including screening questions prior to the visit, additional usage of staff PPE, and extensive cleaning of exam room while observing appropriate contact time as indicated for disinfecting solutions.  ? ?HPI: ?Colleen Henry is a 60 y.o.-year-old female, returning for f/u for DM2, dx in ~2011, insulin-dependent since 10/2019, uncontrolled, without complications. Last visit 4 months ago. ? ?Interim history: ?No increased urination, blurry vision, nausea, chest pain.  ?She continues to have gas pains from Ozempic - Mylanta helps. ?Since last visit she was in the emergency room 06/2021 with back muscle strain. ?She saw Dr. Collene Mares. Abd. MRI pending. ?She had an enlarged heart on CT scan >> will see a cardiologist. ? ?Reviewed HbA1c levels: ?Lab Results  ?Component Value Date  ? HGBA1C 8.0 (A) 04/02/2021  ? HGBA1C 9.0 (A) 11/24/2020  ? HGBA1C 12.0 (A) 09/22/2020  ?11/2014: HbA1c 9.9% ?09/17/2014: HbA1c 12.2% ?04/11/2014: HbA1c 8.6% ? ?Pt was on a regimen of: ?- Metformin XR 1000 mg bid - cannot remember what happened with reg metformin ?- Farxiga 10 mg daily in am - started 07/2015 >> switched to Invokana but ran out  ?- Biocell: Biotin, Zinc, Chromium ?She stopped Amaryl - was taking 2 mg before b'fast ?Insulin was suggested to her >> too expensive 300$/mo ? ?She is on: ?- Metformin ER 1000 mg 2x a day >> 1000 mg in am (not taking the evening dose in last 2 mo) ?- Toujeo 14 units daily  - added 10/2019 >> off 08/2020 >> restarted at 20 >>  (she forgot) ?- Ozempic 0.25 mg weekly-added 08/2020 >> 0.5 mg weekly ?Refused Trulicity in the past, including 03/31/2020 ?Previously on glipizide 5 mg twice a day. ?Tyler Aas was not covered for her. ? ?Pt checks her sugars 1x a day: ?- am:  250s-300s >> 113-157, 169, 183 >> 115-150, 162, 171 >> 135-170 ?- 2h  after b'fast: n/c ?- before lunch: n/c ?- 2h after lunch: 170 >> n/c >> 114 >> n/c  ?- before dinner: n/c >> 96 >> n/c ?- 2h after dinner: 205, 255, 297, 303 >> 134, 158 >> n/c ?- bedtime: n/c >> 97, 140s >> 232 >> n/c ?- nighttime: n/c >> 114 >> n/c ?Lowest sugar was 46 >> 200 >> 113 >> 115 >> 135; it is unclear at which CBG level she has hypoglycemia awareness. ?Highest sugar was 197 >> 380s >> 183 >> 171 >> 170. ? ?Glucometer: UltraTrak >> Easy Touch ? ?-No CKD but she does have microalbuminuria, last BUN/creatinine:  ?02/24/2021: ACR 109.5 ?03/31/2020: ACR 128.8 ?12/26/2019: Glucose 135, BUN/creatinine 13/0.46, GFR 140 ?Lab Results  ?Component Value Date  ? BUN 10 07/14/2021  ? CREATININE 0.45 07/14/2021  ? ?Lab Results  ?Component Value Date  ? MICRALBCREAT 4.5 02/28/2019  ?10/06/2017: Glucose 126, BUN/creatinine 11/0.43, GFR 153, ACR 71.6 ?02/17/2016: ACR 66.8 ?07/31/2015: 14/0.41, eGFR 162 ?01/29/2015: ACR 31.6. ?On lisinopril. ? ?-+ HL; last set of lipids: ?02/24/2021: 216/75/64/139 ?12/26/2019: 216/88/62/139 ?Lab Results  ?Component Value Date  ? CHOL 203 (H) 02/28/2019  ? HDL 52.00 02/28/2019  ? LDLCALC 130 (H) 02/28/2019  ? TRIG 105.0 02/28/2019  ? CHOLHDL 4 02/28/2019  ?10/06/2017: 203/112/49/132 ?04/13/2017: 213/104/54/139 ?07/31/2015: 190/75/54/120 ?09/17/2014: 183/109/47/114 ?04/11/2014: 189/87/48/124  ?She refused statins. ? ?- last eye exam was on 10/06/2020: + DR, was getting IO injections - not during the  pandemic or now. Scheduled for next mo. ? ?- + numbness and tingling in her feet. ? ?ROS:  ?+ See HPI ? ?Past Medical History:  ?Diagnosis Date  ? Complication of anesthesia   ? agitation upon waking up as a child .   ? Depression   ? Diabetes mellitus   ? oral meds for diabetes . Type 2  ? Hypertension   ? Snores   ? ?Past Surgical History:  ?Procedure Laterality Date  ? BREAST CYST ASPIRATION    ? CESAREAN SECTION  01/16/1998  ? HERNIA REPAIR  2013  ? I & D EXTREMITY    ? INSERTION OF MESH  04/20/2012   ? Procedure: INSERTION OF MESH;  Surgeon: Adin Hector, MD;  Location: [facility];  Service: General;  Laterality: N/A;  ? MASS EXCISION Right 04/19/2016  ? Procedure: RIGHT SMALL FINGER EXCISION MASS;  Surgeon: Daryll Brod, MD;  Location: [facility];  Service: Orthopedics;  Laterality: Right;  ? TONSILLECTOMY AND ADENOIDECTOMY  age 67  ? VENTRAL HERNIA REPAIR  04/20/2012  ? Procedure: LAPAROSCOPIC VENTRAL HERNIA;  Surgeon: Adin Hector, MD;  Location: [facility];  Service: General;  Laterality: N/A;  laparoscopic ventral wall hernia repair  ? VENTRAL HERNIA REPAIR  04/20/2012  ? Procedure: HERNIA REPAIR VENTRAL ADULT;  Surgeon: Adin Hector, MD;  Location: [facility];  Service: General;  Laterality: N/A;  Repair incarcerated ventral hernia times two.  ? ?History  ? ?Social History  ? Marital Status: Married  ?  Spouse Name: N/A  ? Number of Children: 1  ? 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 ?Social History Main Topics  ? Smoking status: Never Smoker   ? Smokeless tobacco: Never Used  ? Alcohol Use: Yes  ?   Comment: occasionally - socially -   Wine or vodka once or twice a month , 2-3 drinks  ? Drug Use: No  ? ?Current Outpatient Medications on File Prior to Visit  ?Medication Sig Dispense Refill  ? Continuous Blood Gluc Sensor (FREESTYLE LIBRE 3 SENSOR) MISC 1 each by Does not apply route every 14 (fourteen) days. 6 each 3  ? cyclobenzaprine (FLEXERIL) 10 MG tablet Take 1/2 to 1 whole tablet by mouth every 8 hours as needed for muscle spasms/pain. 15 tablet 0  ? diclofenac (VOLTAREN) 75 MG EC tablet Take 1 tablet (75 mg total) by mouth every 12 (twelve) hours as needed for moderate pain. 20 tablet 0  ? escitalopram (LEXAPRO) 10 MG tablet Take 10 mg by mouth daily.    ? HYDROcodone-acetaminophen (NORCO/VICODIN) 5-325 MG tablet Take 2 tablets by mouth every 4 (four) hours as needed. 20 tablet 0  ? Insulin Pen Needle 32G X 4 MM MISC Use 1x a day 100 each 3  ? lidocaine (LIDODERM) 5 % Place 1  patch onto the skin daily. Remove & Discard patch within 12 hours or as directed by MD 30 patch 0  ? lisinopril-hydrochlorothiazide (PRINZIDE,ZESTORETIC) 20-25 MG tablet Take 1 tablet by mouth daily.    ? metFORMIN (GLUCOPHAGE-XR) 500 MG 24 hr tablet TAKE 2 TABLETS BY MOUTH TWICE DAILY WITHA MEAL 360 tablet 3  ? Semaglutide,0.25 or 0.5MG/DOS, (OZEMPIC, 0.25 OR 0.5 MG/DOSE,) 2 MG/1.5ML SOPN Inject 0.5 mg into the skin once a week. 4.5 mL 3  ? TOUJEO MAX SOLOSTAR 300 UNIT/ML Solostar Pen INJECT 14 UNITS INTO THE SKIN ONCE DAILYAT BEDTIME 6 mL 1  ? ?No current facility-administered medications on file prior  to visit.  ? ?Allergies  ?Allergen Reactions  ? Penicillins Hives  ?  Tolerates keflex  ? ?Family History  ?Problem Relation Age of Onset  ? Hypertension Mother   ? Hyperlipidemia Mother   ? Diabetes Maternal Grandmother   ? Heart disease Maternal Grandfather   ? Hyperlipidemia Maternal Grandfather   ? ?PE: ?BP (!) 144/100 (BP Location: Right Arm, Patient Position: Sitting, Cuff Size: Normal)   Pulse 92   Ht 5' (1.524 m)   Wt 230 lb 12.8 oz (104.7 kg)   LMP 11/12/2015   SpO2 97%   BMI 45.08 kg/m?   ?Wt Readings from Last 3 Encounters:  ?08/12/21 230 lb 12.8 oz (104.7 kg)  ?07/14/21 231 lb (104.8 kg)  ?04/02/21 236 lb 6.4 oz (107.2 kg)  ? ?Constitutional: overweight, in NAD ?Eyes: PERRLA, EOMI, no exophthalmos ?ENT: moist mucous membranes, no thyromegaly, no cervical lymphadenopathy ?Cardiovascular: tachycardia, RR, No MRG ?Respiratory: CTA B ?Musculoskeletal: no deformities, strength intact in all 4 ?Skin: moist, warm, no rashes ?Neurological: no tremor with outstretched hands, DTR normal in all 4 ?Diabetic Foot Exam - Simple   ?Simple Foot Form ?Diabetic Foot exam was performed with the following findings: Yes 08/12/2021  4:26 PM  ?Visual Inspection ?No deformities, no ulcerations, no other skin breakdown bilaterally: Yes ?Sensation Testing ?Intact to touch and monofilament testing bilaterally: Yes ?Pulse  Check ?Comments ?Posterior tibialis palpated but Dorsalis pedis difficult to palpate bilaterally ?  ? ? ?ASSESSMENT: ?1. DM2,  insulin-dependent, uncontrolled, without long-term complications, but with hyperglycem

## 2021-08-13 ENCOUNTER — Ambulatory Visit: Payer: BC Managed Care – PPO | Admitting: Internal Medicine

## 2021-08-24 ENCOUNTER — Other Ambulatory Visit: Payer: BC Managed Care – PPO

## 2021-08-25 ENCOUNTER — Ambulatory Visit
Admission: RE | Admit: 2021-08-25 | Discharge: 2021-08-25 | Disposition: A | Discharge status: Home / Self Care | Payer: BC Managed Care – PPO | Source: Ambulatory Visit | Attending: Gastroenterology | Admitting: Gastroenterology

## 2021-08-25 ENCOUNTER — Other Ambulatory Visit: Payer: Self-pay

## 2021-08-25 DIAGNOSIS — R9389 Abnormal findings on diagnostic imaging of other specified body structures: Secondary | ICD-10-CM

## 2021-08-25 MED ORDER — GADOBENATE DIMEGLUMINE 529 MG/ML IV SOLN
20.0000 mL | Freq: Once | INTRAVENOUS | Status: AC | PRN
  Administered 2021-08-25: 20 mL via INTRAVENOUS

## 2021-08-26 ENCOUNTER — Ambulatory Visit: Payer: BC Managed Care – PPO | Admitting: Internal Medicine

## 2021-08-26 VITALS — BP 156/88 | HR 70 | Ht 60.0 in | Wt 232.0 lb

## 2021-08-26 DIAGNOSIS — R011 Cardiac murmur, unspecified: Secondary | ICD-10-CM

## 2021-08-26 DIAGNOSIS — E785 Hyperlipidemia, unspecified: Secondary | ICD-10-CM

## 2021-08-26 DIAGNOSIS — E119 Type 2 diabetes mellitus without complications: Secondary | ICD-10-CM | POA: Diagnosis not present

## 2021-08-26 DIAGNOSIS — I517 Cardiomegaly: Secondary | ICD-10-CM | POA: Diagnosis not present

## 2021-08-26 NOTE — Addendum Note (Signed)
Addended by: Pixie Casino on: 08/26/2021 05:02 PM ? ? Modules accepted: Level of Service ? ?

## 2021-08-26 NOTE — Progress Notes (Signed)
? ?OFFICE CONSULT NOTE ? ?Chief Complaint:  ?Cardiomegaly ? ?Primary Care Physician: ?Lennie Odor, PA ? ?HPI:  ?Colleen Henry is a 61 y.o. female who is being seen today for the evaluation of cardiomegaly at the request of [place], [place], [place].  This is a pleasant 61 year old female kindly referred for evaluation management of cardiomegaly.  She had undergone some imaging for abdominal discomfort including CT/MRI which has demonstrated some degree of cardiomegaly back in 2020 however recently the radiologist had noted that the heart appeared incrementally larger compared to that study.  It was not noted that there was any pericardial effusion.  Based on these findings, it was recommended that she see cardiology.  She does have a history of hypertension with possibly suboptimally controlled blood pressure.  She also has diabetes followed by Dr. Cruzita Lederer with A1c of 8.0 in November 2022.  Lipids at that time also showed total cholesterol 216, HDL 64, triglycerides 75 and LDL 139.  Her PCP had discussed starting statin therapy before given the recommendations for diabetics however she has declined.  She denies any coronary symptoms such as chest pain or worsening shortness of breath.  She does note some fatigue throughout the day and is at increased risk for obstructive sleep apnea giving weight and neck thickness.  She says that she was told that she snores but just lightly and does not have wakening headaches in the morning but does get up multiple times during the night ? ?PMHx:  ?Past Medical History:  ?Diagnosis Date  ? Complication of anesthesia   ? agitation upon waking up as a child .   ? Depression   ? Diabetes mellitus   ? oral meds for diabetes . Type 2  ? Hypertension   ? Snores   ? ? ?Past Surgical History:  ?Procedure Laterality Date  ? BREAST CYST ASPIRATION    ? CESAREAN SECTION  01/16/1998  ? HERNIA REPAIR  2013  ? I & D EXTREMITY    ? INSERTION OF MESH  04/20/2012  ? Procedure: INSERTION OF MESH;   Surgeon: Adin Hector, MD;  Location: [facility];  Service: General;  Laterality: N/A;  ? MASS EXCISION Right 04/19/2016  ? Procedure: RIGHT SMALL FINGER EXCISION MASS;  Surgeon: Daryll Brod, MD;  Location: [place] [facility];  Service: Orthopedics;  Laterality: Right;  ? TONSILLECTOMY AND ADENOIDECTOMY  age 50  ? VENTRAL HERNIA REPAIR  04/20/2012  ? Procedure: LAPAROSCOPIC VENTRAL HERNIA;  Surgeon: Adin Hector, MD;  Location: [place] [place];  Service: General;  Laterality: N/A;  laparoscopic ventral wall hernia repair  ? VENTRAL HERNIA REPAIR  04/20/2012  ? Procedure: HERNIA REPAIR VENTRAL ADULT;  Surgeon: Adin Hector, MD;  Location: [facility] [facility];  Service: General;  Laterality: N/A;  Repair incarcerated ventral hernia times two.  ? ? ?FAMHx:  ?Family History  ?Problem Relation Age of Onset  ? Hypertension Mother   ? Hyperlipidemia Mother   ? Diabetes Maternal Grandmother   ? Heart disease Maternal Grandfather   ? Hyperlipidemia Maternal Grandfather   ? ? ?SOCHx:  ? reports that she has never smoked. She has never used smokeless tobacco. She reports current alcohol use. She reports that she does not use drugs. ? ?ALLERGIES:  ?Allergies  ?Allergen Reactions  ? Penicillins Hives  ?  Tolerates keflex  ? ? ?ROS: ?Pertinent items noted in HPI and remainder of comprehensive ROS otherwise negative. ? ?HOME MEDS: ?Current Outpatient Medications on File Prior to Visit  ?Medication  Sig Dispense Refill  ? Continuous Blood Gluc Sensor (FREESTYLE LIBRE 3 SENSOR) MISC 1 each by Does not apply route every 14 (fourteen) days. 6 each 3  ? escitalopram (LEXAPRO) 10 MG tablet Take 10 mg by mouth daily.    ? Insulin Pen Needle 32G X 4 MM MISC Use 1x a day 100 each 3  ? lisinopril-hydrochlorothiazide (PRINZIDE,ZESTORETIC) 20-25 MG tablet Take 1 tablet by mouth daily.    ? metFORMIN (GLUCOPHAGE-XR) 500 MG 24 hr tablet TAKE 2 TABLETS BY MOUTH TWICE DAILY WITHA MEAL 360 tablet 3  ? Semaglutide,0.25 or 0.'5MG'$ /DOS, (OZEMPIC, 0.25 OR 0.5 MG/DOSE,)  2 MG/1.5ML SOPN Inject 0.5 mg into the skin once a week. 4.5 mL 3  ? TOUJEO MAX SOLOSTAR 300 UNIT/ML Solostar Pen INJECT 14 UNITS INTO THE SKIN ONCE DAILYAT BEDTIME (Patient taking differently: 20 Units. INJECT 14 UNITS INTO THE SKIN ONCE DAILYAT BEDTIME) 6 mL 1  ? ?No current facility-administered medications on file prior to visit.  ? ? ?LABS/IMAGING: ?No results found for this or any previous visit (from the past 48 hour(s)). ?MR LIVER W WO CONTRAST ? ?Result Date: 08/26/2021 ?CLINICAL DATA:  Liver evaluation, abnormal ultrasound EXAM: MRI ABDOMEN WITHOUT AND WITH CONTRAST TECHNIQUE: Multiplanar multisequence MR imaging of the abdomen was performed both before and after the administration of intravenous contrast. CONTRAST:  54m MULTIHANCE GADOBENATE DIMEGLUMINE 529 MG/ML IV SOLN COMPARISON:  CT abdomen and ultrasound abdomen 07/14/2021 FINDINGS: Study is limited due to motion. Lower chest: No acute findings. Hepatobiliary: Liver is enlarged measuring 21 cm in length. Evidence of mild hepatic steatosis. Stable simple cyst in the central liver measuring 2.4 cm. No additional hepatic lesions identified. Gallbladder appears normal. No biliary ductal dilatation. Pancreas: Mildly atrophic. 4 mm cystic focus in the body of the pancreas which appears to communicate with the pancreatic duct. No pancreatic ductal dilatation. Spleen:  Within normal limits in size and appearance. Adrenals/Urinary Tract: Stable 14 mm left adrenal gland adenoma. 7 mm cyst in the right kidney. Kidneys appear otherwise normal. Stomach/Bowel: No evidence of bowel obstruction. Vascular/Lymphatic: No pathologically enlarged lymph nodes identified. No abdominal aortic aneurysm demonstrated. Other:  No ascites. Musculoskeletal: No suspicious bony lesions identified. IMPRESSION: 1. Hepatomegaly with evidence of mild hepatic steatosis. Stable cyst in the central liver with no additional hepatic lesions identified. 2. Mildly atrophic pancreas with a 4  mm cystic focus in the body of the pancreas which may represent a prominent ductal side branch, side-branch IPMN, or cyst. Recommend follow-up MRI abdomen with contrast in 12 months. 3. Other stable chronic findings. Electronically Signed   By: DOfilia NeasM.D.   On: 08/26/2021 09:54   ? ?LIPID PANEL: ?   ?Component Value Date/Time  ? CHOL 203 (H) 02/28/2019 1549  ? TRIG 105.0 02/28/2019 1549  ? HDL 52.00 02/28/2019 1549  ? CHOLHDL 4 02/28/2019 1549  ? VLDL 21.0 02/28/2019 1549  ? [place]130 (H) 02/28/2019 1549  ? ? ?WEIGHTS: ?Wt Readings from Last 3 Encounters:  ?08/26/21 232 lb (105.2 kg)  ?08/12/21 230 lb 12.8 oz (104.7 kg)  ?07/14/21 231 lb (104.8 kg)  ? ? ?VITALS: ?BP (!) 156/88 (BP Location: Left Arm, Patient Position: Sitting, Cuff Size: Large)   Pulse 70   Ht 5' (1.524 m)   Wt 232 lb (105.2 kg)   LMP 11/12/2015   BMI 45.31 kg/m?  ? ?EXAM: ?General appearance: alert, no distress, and morbidly obese ?Neck: no carotid bruit, no JVD, and thyroid not enlarged, symmetric, no tenderness/mass/nodules ?  Lungs: clear to auscultation bilaterally ?Heart: regular rate and rhythm, S1, S2 normal, and diastolic murmur: early diastolic 2/6, blowing at 2nd left intercostal space ?Abdomen: soft, non-tender; bowel sounds normal; no masses,  no organomegaly and obese ?Extremities: edema trace bilateral  ?Pulses: 2+ and symmetric ?Skin: Skin color, texture, turgor normal. No rashes or lesions ?Neurologic: Grossly normal ?Psych: Pleasant ? ?EKG: ?Normal sinus rhythm at 70, possible left atrial enlargement, incomplete right bundle branch block, LVH by voltage- personally reviewed ? ?ASSESSMENT: ?Cardiomegaly ?History of hypertension, uncontrolled ?Abnormal EKG demonstrating LVH by voltage and possible left atrial enlargement ?Murmur ?Morbid obesity ?Type 2 diabetes ?Dyslipidemia ? ?PLAN: ?1.   Ms. Quinn has imaging evidence of cardiomegaly which I suspect is likely LVH.  She does have a murmur which I believe is  diastolic, however would like to get an echocardiogram to further evaluate this.  She could have some dilation of the left ventricle or LVH alone causing her cardiomegaly.  Blood pressure appears to be not ideall

## 2021-08-26 NOTE — Patient Instructions (Signed)
Medication Instructions:  ?Your physician recommends that you continue on your current medications as directed. Please refer to the Current Medication list given to you today. ? ?*If you need a refill on your cardiac medications before your next appointment, please call your pharmacy* ? ? ?Testing/Procedures: ?Your physician has requested that you have an echocardiogram. Echocardiography is a painless test that uses sound waves to create images of your heart. It provides your doctor with information about the size and shape of your heart and how well your heart?s chambers and valves are working. This procedure takes approximately one hour. There are no restrictions for this procedure. ?-- done at [facility] ([address] 3rd Floor) or [employer] (2nd Floor) ? ? ?Follow-Up: ?At [facility], you and your health needs are our priority.  As part of our continuing mission to provide you with exceptional heart care, we have created designated Provider Care Teams.  These Care Teams include your primary Cardiologist (physician) and Advanced Practice Providers (APPs -  Physician Assistants and Nurse Practitioners) who all work together to provide you with the care you need, when you need it. ? ?We recommend signing up for the patient portal called "MyChart".  Sign up information is provided on this After Visit Summary.  MyChart is used to connect with patients for Virtual Visits (Telemedicine).  Patients are able to view lab/test results, encounter notes, upcoming appointments, etc.  Non-urgent messages can be sent to your provider as well.   ?To learn more about what you can do with MyChart, go to [email or web address].   ? ?Your next appointment:   ?4-6 weeks -- after echo -- OK to schedule on DOD day with Dr. Debara Pickett ? ?

## 2021-08-28 ENCOUNTER — Other Ambulatory Visit: Payer: BC Managed Care – PPO

## 2021-09-04 ENCOUNTER — Other Ambulatory Visit: Payer: Self-pay | Admitting: Internal Medicine

## 2021-09-08 ENCOUNTER — Other Ambulatory Visit: Payer: Self-pay | Admitting: Obstetrics and Gynecology

## 2021-09-08 DIAGNOSIS — Z1231 Encounter for screening mammogram for malignant neoplasm of breast: Secondary | ICD-10-CM

## 2021-09-09 DIAGNOSIS — R300 Dysuria: Secondary | ICD-10-CM | POA: Diagnosis not present

## 2021-09-09 DIAGNOSIS — K760 Fatty (change of) liver, not elsewhere classified: Secondary | ICD-10-CM | POA: Diagnosis not present

## 2021-09-09 DIAGNOSIS — E7800 Pure hypercholesterolemia, unspecified: Secondary | ICD-10-CM | POA: Diagnosis not present

## 2021-09-09 DIAGNOSIS — I700 Atherosclerosis of aorta: Secondary | ICD-10-CM | POA: Diagnosis not present

## 2021-09-14 ENCOUNTER — Ambulatory Visit (HOSPITAL_COMMUNITY): Payer: BC Managed Care – PPO | Attending: Cardiology

## 2021-09-14 DIAGNOSIS — I517 Cardiomegaly: Secondary | ICD-10-CM | POA: Diagnosis not present

## 2021-09-14 DIAGNOSIS — R011 Cardiac murmur, unspecified: Secondary | ICD-10-CM | POA: Insufficient documentation

## 2021-09-14 LAB — ECHOCARDIOGRAM COMPLETE
AR max vel: 1.86 cm2
AV Area VTI: 1.81 cm2
AV Area mean vel: 1.8 cm2
AV Mean grad: 9 mmHg
AV Peak grad: 15.8 mmHg
Ao pk vel: 1.99 m/s
Area-P 1/2: 2.97 cm2
S' Lateral: 2.5 cm

## 2021-09-23 ENCOUNTER — Ambulatory Visit
Admission: RE | Admit: 2021-09-23 | Discharge: 2021-09-23 | Disposition: A | Discharge status: Home / Self Care | Payer: BC Managed Care – PPO | Source: Ambulatory Visit | Attending: Obstetrics and Gynecology | Admitting: Obstetrics and Gynecology

## 2021-09-23 DIAGNOSIS — Z1231 Encounter for screening mammogram for malignant neoplasm of breast: Secondary | ICD-10-CM

## 2021-10-14 ENCOUNTER — Ambulatory Visit: Payer: BC Managed Care – PPO | Admitting: Internal Medicine

## 2021-10-14 ENCOUNTER — Encounter: Payer: Self-pay | Admitting: Internal Medicine

## 2021-10-14 VITALS — BP 147/85 | HR 70 | Ht 60.08 in | Wt 231.8 lb

## 2021-10-14 DIAGNOSIS — I517 Cardiomegaly: Secondary | ICD-10-CM | POA: Diagnosis not present

## 2021-10-14 DIAGNOSIS — Q2112 Patent foramen ovale: Secondary | ICD-10-CM

## 2021-10-14 DIAGNOSIS — E119 Type 2 diabetes mellitus without complications: Secondary | ICD-10-CM | POA: Diagnosis not present

## 2021-10-14 DIAGNOSIS — R011 Cardiac murmur, unspecified: Secondary | ICD-10-CM | POA: Diagnosis not present

## 2021-10-14 DIAGNOSIS — E785 Hyperlipidemia, unspecified: Secondary | ICD-10-CM

## 2021-10-14 NOTE — Patient Instructions (Signed)
Medication Instructions:  Your physician recommends that you continue on your current medications as directed. Please refer to the Current Medication list given to you today.  *If you need a refill on your cardiac medications before your next appointment, please call your pharmacy*   Follow-Up: At [facility], you and your health needs are our priority.  As part of our continuing mission to provide you with exceptional heart care, we have created designated Provider Care Teams.  These Care Teams include your primary Cardiologist (physician) and Advanced Practice Providers (APPs -  Physician Assistants and Nurse Practitioners) who all work together to provide you with the care you need, when you need it.  We recommend signing up for the patient portal called "MyChart".  Sign up information is provided on this After Visit Summary.  MyChart is used to connect with patients for Virtual Visits (Telemedicine).  Patients are able to view lab/test results, encounter notes, upcoming appointments, etc.  Non-urgent messages can be sent to your provider as well.   To learn more about what you can do with MyChart, go to [email or web address].    Your next appointment:   12 month(s)  The format for your next appointment:   In Person  Provider:   Lyman Bishop MD

## 2021-10-14 NOTE — Progress Notes (Signed)
OFFICE CONSULT NOTE  Chief Complaint:  Follow-up cardiomegaly  Primary Care Physician: Lennie Odor, PA  HPI:  Colleen Henry is a 61 y.o. female who is being seen today for the evaluation of cardiomegaly at the request of [place], [place], [place].  This is a pleasant 61 year old female kindly referred for evaluation management of cardiomegaly.  She had undergone some imaging for abdominal discomfort including CT/MRI which has demonstrated some degree of cardiomegaly back in 2020 however recently the radiologist had noted that the heart appeared incrementally larger compared to that study.  It was not noted that there was any pericardial effusion.  Based on these findings, it was recommended that she see cardiology.  She does have a history of hypertension with possibly suboptimally controlled blood pressure.  She also has diabetes followed by Dr. Cruzita Lederer with A1c of 8.0 in November 2022.  Lipids at that time also showed total cholesterol 216, HDL 64, triglycerides 75 and LDL 139.  Her PCP had discussed starting statin therapy before given the recommendations for diabetics however she has declined.  She denies any coronary symptoms such as chest pain or worsening shortness of breath.  She does note some fatigue throughout the day and is at increased risk for obstructive sleep apnea giving weight and neck thickness.  She says that she was told that she snores but just lightly and does not have wakening headaches in the morning but does get up multiple times during the night.  10/14/2021  Colleen Henry returns today for follow-up of cardiomegaly.  She underwent an echocardiogram which is also to evaluate for murmur and left atrial enlargement by EKG.  The echo did show normal LVEF 60 to 65%, mild LVH and grade 1 diastolic dysfunction.  The left atrium was moderately dilated.  Findings are more consistent with hypertensive heart disease.  Only trivial mitral regurgitation was noted.  A PFO was noted with  left-to-right shunting and appears small to moderate in size.  PMHx:  Past Medical History:  Diagnosis Date   Complication of anesthesia    agitation upon waking up as a child .    Depression    Diabetes mellitus    oral meds for diabetes . Type 2   Hypertension    Snores     Past Surgical History:  Procedure Laterality Date   BREAST CYST ASPIRATION     CESAREAN SECTION  01/16/1998   HERNIA REPAIR  2013   I & D EXTREMITY     INSERTION OF MESH  04/20/2012   Procedure: INSERTION OF MESH;  Surgeon: Adin Hector, MD;  Location: [place];  Service: General;  Laterality: N/A;   MASS EXCISION Right 04/19/2016   Procedure: RIGHT SMALL FINGER EXCISION MASS;  Surgeon: Daryll Brod, MD;  Location: [place];  Service: Orthopedics;  Laterality: Right;   TONSILLECTOMY AND ADENOIDECTOMY  age 27   VENTRAL HERNIA REPAIR  04/20/2012   Procedure: LAPAROSCOPIC VENTRAL HERNIA;  Surgeon: Adin Hector, MD;  Location: [place];  Service: General;  Laterality: N/A;  laparoscopic ventral wall hernia repair   VENTRAL HERNIA REPAIR  04/20/2012   Procedure: HERNIA REPAIR VENTRAL ADULT;  Surgeon: Adin Hector, MD;  Location: [place];  Service: General;  Laterality: N/A;  Repair incarcerated ventral hernia times two.    FAMHx:  Family History  Problem Relation Age of Onset   Hypertension Mother    Hyperlipidemia Mother    Diabetes Maternal Grandmother    Heart disease  Maternal Grandfather    Hyperlipidemia Maternal Grandfather     SOCHx:   reports that she has never smoked. She has never used smokeless tobacco. She reports current alcohol use. She reports that she does not use drugs.  ALLERGIES:  Allergies  Allergen Reactions   Penicillins Hives    Tolerates keflex    ROS: Pertinent items noted in HPI and remainder of comprehensive ROS otherwise negative.  HOME MEDS: Current Outpatient Medications on File Prior to Visit  Medication Sig Dispense Refill   atorvastatin (LIPITOR)  10 MG tablet Take 5 mg by mouth at bedtime.     Continuous Blood Gluc Sensor (FREESTYLE LIBRE 3 SENSOR) MISC 1 each by Does not apply route every 14 (fourteen) days. 6 each 3   escitalopram (LEXAPRO) 10 MG tablet Take 10 mg by mouth daily.     Insulin Pen Needle 32G X 4 MM MISC Use 1x a day 100 each 3   lisinopril-hydrochlorothiazide (PRINZIDE,ZESTORETIC) 20-25 MG tablet Take 1 tablet by mouth daily.     metFORMIN (GLUCOPHAGE) 1000 MG tablet 2 tablets     Semaglutide,0.25 or 0.'5MG'$ /DOS, (OZEMPIC, 0.25 OR 0.5 MG/DOSE,) 2 MG/1.5ML SOPN Inject 0.5 mg into the skin once a week. 4.5 mL 3   TOUJEO MAX SOLOSTAR 300 UNIT/ML Solostar Pen INJECT 14 UNITS INTO THE SKIN ONCE DAILYAT BEDTIME (Patient taking differently: 20 Units. INJECT 14 UNITS INTO THE SKIN ONCE DAILYAT BEDTIME) 6 mL 1   Vitamin D, Ergocalciferol, (DRISDOL) 1.25 MG (50000 UNIT) CAPS capsule Take 50,000 Units by mouth once a week.     No current facility-administered medications on file prior to visit.    LABS/IMAGING: No results found for this or any previous visit (from the past 48 hour(s)). No results found.  LIPID PANEL:    Component Value Date/Time   CHOL 203 (H) 02/28/2019 1549   TRIG 105.0 02/28/2019 1549   HDL 52.00 02/28/2019 1549   CHOLHDL 4 02/28/2019 1549   VLDL 21.0 02/28/2019 1549   LDLCALC 130 (H) 02/28/2019 1549    WEIGHTS: Wt Readings from Last 3 Encounters:  10/14/21 231 lb 12.8 oz (105.1 kg)  08/26/21 232 lb (105.2 kg)  08/12/21 230 lb 12.8 oz (104.7 kg)    VITALS: BP (!) 147/85   Pulse 70   Ht 5' 0.08" (1.526 m)   Wt 231 lb 12.8 oz (105.1 kg)   LMP 11/12/2015   SpO2 98%   BMI 45.16 kg/m   EXAM: General appearance: alert, no distress, and morbidly obese Neck: no carotid bruit, no JVD, and thyroid not enlarged, symmetric, no tenderness/mass/nodules Lungs: clear to auscultation bilaterally Heart: regular rate and rhythm, S1, S2 normal, and diastolic murmur: early diastolic 2/6, blowing at 2nd left  intercostal space Abdomen: soft, non-tender; bowel sounds normal; no masses,  no organomegaly and obese Extremities: edema trace bilateral  Pulses: 2+ and symmetric Skin: Skin color, texture, turgor normal. No rashes or lesions Neurologic: Grossly normal Psych: Pleasant  EKG: Normal sinus rhythm at 70, possible left atrial enlargement, incomplete right bundle branch block, LVH by voltage- personally reviewed  ASSESSMENT: Cardiomegaly -mild LVH, moderate left atrial enlargement (LVEF 60 to 65%, 10/14/2021) History of hypertension, uncontrolled Abnormal EKG demonstrating LVH by voltage and possible left atrial enlargement Murmur Morbid obesity Type 2 diabetes Dyslipidemia Small to moderate size PFO with left-to-right shunting  PLAN: 1.   Colleen Henry has enlargement of her heart due to left ventricular hypertrophy and moderate left atrial enlargement.  This is likely related  to uncontrolled hypertension.  She was also noted to have a small to moderate-sized PFO with left-to-right shunting.  She has been asymptomatic with this therefore closure is not necessarily indicated.  He needs continued aggressive risk factor modification including better blood pressure control, weight loss and improvements in her dyslipidemia.  Her last LDL in September 2022 was 139, but as a diabetic should be targeted to LDL less than 70.  She is currently only on 5 mg atorvastatin by her PCP.  I would recommend 40 mg to help reach her target.  I will defer this to her primary provider who is managing it.  In addition, would consider additional medication such as amlodipine along with her lisinopril HCTZ to help reach a goal blood pressure 120/80 or lower.  Plan follow-up with me annually or sooner as necessary.  Pixie Casino, MD, [facility], [place] Director of the [facility]  [facility] Diplomate of the American Board of Clinical  Lipidology Attending Cardiologist  Direct Dial: [phone]  Fax: [phone]  Website:  [email or web address] Vivona 10/14/2021, 11:18 AM

## 2021-10-26 ENCOUNTER — Other Ambulatory Visit: Payer: Self-pay | Admitting: Internal Medicine

## 2021-11-15 ENCOUNTER — Ambulatory Visit
Admission: RE | Admit: 2021-11-15 | Discharge: 2021-11-15 | Disposition: A | Discharge status: Home / Self Care | Payer: BC Managed Care – PPO | Source: Ambulatory Visit | Attending: Internal Medicine | Admitting: Internal Medicine

## 2021-11-15 VITALS — BP 177/98 | HR 72 | Temp 101.6°F | Resp 20

## 2021-11-15 DIAGNOSIS — B349 Viral infection, unspecified: Secondary | ICD-10-CM

## 2021-11-15 MED ORDER — ACETAMINOPHEN 325 MG PO TABS
650.0000 mg | ORAL_TABLET | Freq: Once | ORAL | Status: AC
  Administered 2021-11-15: 650 mg via ORAL

## 2021-11-15 NOTE — ED Provider Notes (Signed)
[facility]    CSN: [id number] Arrival date & time: 11/15/21  1201      History   Chief Complaint Chief Complaint  Patient presents with   Fever   Generalized Body Aches   Headache    HPI Colleen Henry is a 61 y.o. female was to the urgent care with fever, generalized body aches and a headache.  Patient's symptoms started yesterday and has been persistent.  No nausea, vomiting or diarrhea.  No sick contacts.  Patient has a fever of 101.6 Fahrenheit on arrival in the urgent care.  Patient denies any sore throat.  No difficulty breathing, cough or sputum production.  No wheezing.  No sick contacts.  Patient would like to be tested for COVID-19 infection. HPI  Past Medical History:  Diagnosis Date   Complication of anesthesia    agitation upon waking up as a child .    Depression    Diabetes mellitus    oral meds for diabetes . Type 2   Hypertension    Snores     Patient Active Problem List   Diagnosis Date Noted   Hyperlipidemia 02/28/2019   Type 2 diabetes mellitus with hyperglycemia, without long-term current use of insulin ([place]) 08/26/2015   Ventral hernia x2 s/p lap [place] repair w mesh 04/20/2012 01/17/2012   HTN (hypertension) 01/17/2012   Depression 01/17/2012   Obesity, Class III, BMI 40-49.9 (morbid obesity) ([place]) 01/17/2012    Past Surgical History:  Procedure Laterality Date   BREAST CYST ASPIRATION     CESAREAN SECTION  01/16/1998   HERNIA REPAIR  2013   I & D EXTREMITY     INSERTION OF MESH  04/20/2012   Procedure: INSERTION OF MESH;  Surgeon: Adin Hector, MD;  Location: [place];  Service: General;  Laterality: N/A;   MASS EXCISION Right 04/19/2016   Procedure: RIGHT SMALL FINGER EXCISION MASS;  Surgeon: Daryll Brod, MD;  Location: [place];  Service: Orthopedics;  Laterality: Right;   TONSILLECTOMY AND ADENOIDECTOMY  age 33   VENTRAL HERNIA REPAIR  04/20/2012   Procedure: LAPAROSCOPIC VENTRAL HERNIA;  Surgeon: Adin Hector, MD;  Location: [place];  Service: General;  Laterality: N/A;  laparoscopic ventral wall hernia repair   VENTRAL HERNIA REPAIR  04/20/2012   Procedure: HERNIA REPAIR VENTRAL ADULT;  Surgeon: Adin Hector, MD;  Location: [place];  Service: General;  Laterality: N/A;  Repair incarcerated ventral hernia times two.    OB History   No obstetric history on file.      Home Medications    Prior to Admission medications   Medication Sig Start Date End Date Taking? Authorizing Provider  atorvastatin (LIPITOR) 10 MG tablet Take 5 mg by mouth at bedtime. 09/09/21   [provider]  Continuous Blood Gluc Sensor (FREESTYLE LIBRE 3 SENSOR) MISC 1 each by Does not apply route every 14 (fourteen) days. 04/02/21   Philemon Kingdom, MD  escitalopram (LEXAPRO) 10 MG tablet Take 10 mg by mouth daily. 03/09/20   [provider]  Insulin Pen Needle 32G X 4 MM MISC Use 1x a day 11/08/19   Philemon Kingdom, MD  lisinopril-hydrochlorothiazide (PRINZIDE,ZESTORETIC) 20-25 MG tablet Take 1 tablet by mouth daily.    [provider]  metFORMIN (GLUCOPHAGE) 1000 MG tablet 2 tablets 09/05/14   [provider]  OZEMPIC, 0.25 OR 0.5 MG/DOSE, 2 MG/3ML SOPN INJECT 0.5 MGS SUBCUTANEOUSLY ONCE WEEKLY 10/26/21   Philemon Kingdom, MD  Semaglutide,0.25  or 0.'5MG'$ /DOS, (OZEMPIC, 0.25 OR 0.5 MG/DOSE,) 2 MG/1.5ML SOPN Inject 0.5 mg into the skin once a week. 09/22/20   Philemon Kingdom, MD  TOUJEO MAX SOLOSTAR 300 UNIT/ML Solostar Pen INJECT 14 UNITS INTO THE SKIN ONCE DAILYAT BEDTIME Patient taking differently: 20 Units. INJECT 14 UNITS INTO THE SKIN ONCE DAILYAT BEDTIME 07/12/21   Philemon Kingdom, MD  Vitamin D, Ergocalciferol, (DRISDOL) 1.25 MG (50000 UNIT) CAPS capsule Take 50,000 Units by mouth once a week. 06/30/21   [provider]    Family History Family History  Problem Relation Age of Onset   Hypertension Mother    Hyperlipidemia Mother    Diabetes Maternal Grandmother     Heart disease Maternal Grandfather    Hyperlipidemia Maternal Grandfather     Social History Social History   Tobacco Use   Smoking status: Never   Smokeless tobacco: Never  Vaping Use   Vaping Use: Never used  Substance Use Topics   Alcohol use: Yes    Comment: occasionally - socially   Drug use: No     Allergies   Penicillins   Review of Systems Review of Systems  Constitutional:  Positive for chills and fever. Negative for activity change.  Respiratory:  Positive for cough. Negative for shortness of breath and wheezing.   Cardiovascular:  Positive for chest pain.  Gastrointestinal: Negative.   Genitourinary: Negative.   Musculoskeletal:  Positive for arthralgias and myalgias. Negative for neck pain and neck stiffness.  Neurological:  Positive for headaches.     Physical Exam Triage Vital Signs ED Triage Vitals  Enc Vitals Group     BP 11/15/21 1220 (!) 177/98     Pulse Rate 11/15/21 1220 72     Resp 11/15/21 1220 20     Temp 11/15/21 1220 (!) 101.6 F (38.7 C)     Temp Source 11/15/21 1220 Oral     SpO2 11/15/21 1220 94 %     Weight --      Height --      Head Circumference --      Peak Flow --      Pain Score 11/15/21 1222 4     Pain Loc --      Pain Edu? --      Excl. in [place]? --    No data found.  Updated Vital Signs BP (!) 177/98 (BP Location: Left Arm)   Pulse 72   Temp (!) 101.6 F (38.7 C) (Oral)   Resp 20   LMP 11/12/2015   SpO2 94%   Visual Acuity Right Eye Distance:   Left Eye Distance:   Bilateral Distance:    Right Eye Near:   Left Eye Near:    Bilateral Near:     Physical Exam Vitals and nursing note reviewed.  Constitutional:      General: She is not in acute distress.    Appearance: She is not ill-appearing.  HENT:     Mouth/Throat:     Mouth: Mucous membranes are moist.  Cardiovascular:     Rate and Rhythm: Normal rate and regular rhythm.  Pulmonary:     Effort: Pulmonary effort is normal.     Breath sounds:  Normal breath sounds.  Abdominal:     General: Bowel sounds are normal.     Palpations: Abdomen is soft.  Musculoskeletal:     Cervical back: Normal range of motion.  Lymphadenopathy:     Cervical: No cervical adenopathy.  Neurological:     Mental  Status: She is alert.     GCS: GCS eye subscore is 4. GCS verbal subscore is 5. GCS motor subscore is 6.      UC Treatments / Results  Labs (all labs ordered are listed, but only abnormal results are displayed) Labs Reviewed  NOVEL CORONAVIRUS, NAA    EKG   Radiology No results found.  Procedures Procedures (including critical care time)  Medications Ordered in UC Medications  acetaminophen (TYLENOL) tablet 650 mg (650 mg Oral Given 11/15/21 1227)    Initial Impression / Assessment and Plan / UC Course  I have reviewed the triage vital signs and the nursing notes.  Pertinent labs & imaging results that were available during my care of the patient were reviewed by me and considered in my medical decision making (see chart for details).     1.  Acute viral illness: Increase oral fluid intake COVID-19 PCR test has been sent Tylenol/Motrin as needed for pain and/or fever Return precautions given We will call patient with recommendations if labs are abnormal. Final Clinical Impressions(s) / UC Diagnoses   Final diagnoses:  Viral illness     Discharge Instructions      Increase oral fluid intake Tylenol/Motrin as needed for pain and/or fever We will call you with recommendations if labs are abnormal Return to urgent care if you have worsening symptoms.    ED Prescriptions   None    PDMP not reviewed this encounter.   Chase Picket, MD 11/15/21 [phone]

## 2021-11-15 NOTE — ED Triage Notes (Signed)
Pt presents with slight headache, fever, and generalized body aches since yesterday.

## 2021-11-15 NOTE — Discharge Instructions (Addendum)
Increase oral fluid intake Tylenol/Motrin as needed for pain and/or fever We will call you with recommendations if labs are abnormal Return to urgent care if you have worsening symptoms.

## 2021-11-16 LAB — NOVEL CORONAVIRUS, NAA: SARS-CoV-2, NAA: NOT DETECTED

## 2021-12-10 DIAGNOSIS — E7800 Pure hypercholesterolemia, unspecified: Secondary | ICD-10-CM | POA: Diagnosis not present

## 2021-12-14 ENCOUNTER — Ambulatory Visit: Payer: BC Managed Care – PPO | Admitting: Internal Medicine

## 2021-12-14 ENCOUNTER — Encounter: Payer: Self-pay | Admitting: Internal Medicine

## 2021-12-14 VITALS — BP 120/76 | HR 77 | Ht 60.08 in | Wt 233.4 lb

## 2021-12-14 DIAGNOSIS — E785 Hyperlipidemia, unspecified: Secondary | ICD-10-CM | POA: Diagnosis not present

## 2021-12-14 DIAGNOSIS — Z794 Long term (current) use of insulin: Secondary | ICD-10-CM

## 2021-12-14 DIAGNOSIS — E1165 Type 2 diabetes mellitus with hyperglycemia: Secondary | ICD-10-CM

## 2021-12-14 LAB — POCT GLYCOSYLATED HEMOGLOBIN (HGB A1C): Hemoglobin A1C: 9.2 % — AB (ref 4.0–5.6)

## 2021-12-14 MED ORDER — SEMAGLUTIDE (1 MG/DOSE) 4 MG/3ML ~~LOC~~ SOPN: 1.0000 mg | PEN_INJECTOR | SUBCUTANEOUS | 3 refills | Status: DC

## 2021-12-14 MED ORDER — TOUJEO MAX SOLOSTAR 300 UNIT/ML ~~LOC~~ SOPN: 28.0000 [IU] | PEN_INJECTOR | Freq: Every day | SUBCUTANEOUS | 3 refills | Status: DC

## 2021-12-14 NOTE — Progress Notes (Signed)
Patient ID: Colleen Henry, female   DOB: 12/11/1960, 61 y.o.   MRN: [id number]  HPI: Colleen Henry is a 61 y.o.-year-old female-year-old female, returning for f/u for DM2, dx in ~2011, insulin-dependent since 10/2019, uncontrolled, without complications. Last visit 4 months ago.  Interim history: No increased urination, blurry vision, nausea, chest pain.  She had gas pains from Ozempic - Mylanta helping >> now improved. She saw cardiology since last visit for cardiomegaly and PFO. She was finally started on a statin.   At this visit she brings a freestyle libre CGM with her.  She did not start it yet.  Reviewed HbA1c levels: Lab Results  Component Value Date   HGBA1C 8.3 (A) 08/12/2021   HGBA1C 8.0 (A) 04/02/2021   HGBA1C 9.0 (A) 11/24/2020  11/2014: HbA1c 9.9% 09/17/2014: HbA1c 12.2% 04/11/2014: HbA1c 8.6%  Pt was on a regimen of: - Metformin XR 1000 mg bid - cannot remember what happened with reg metformin - Farxiga 10 mg daily in am - started 07/2015 >> switched to Invokana but ran out  - Biocell: Biotin, Zinc, Chromium She stopped Amaryl - was taking 2 mg before b'fast Insulin was suggested to her >> too expensive 300$/mo  At last visit she was on: - Metformin ER 1000 mg 2x a day >> 1000 mg in am (not taking the evening dose in last 2 mo) - Toujeo 14 units daily  - added 10/2019 >> off 08/2020 >> restarted at 20 >>  (she forgot) - Ozempic 0.25 mg weekly-added 08/2020 >> 0.5 mg weekly Refused Trulicity in the past, including 03/31/2020 Previously on glipizide 5 mg twice a day. Tyler Aas was not covered for her.  We changed the regimen: - Metformin ER 1000 mg 2x a day - Ozempic 0.5 mg weekly in a.m.  - Toujeo 24 >> 20 units daily  Pt does not check her sugars...: - am:  250s-300s >> 113-157, 169, 183 >> 115-150, 162, 171 >> 135-170 - 2h after b'fast: n/c - before lunch: n/c - 2h after lunch: 170 >> n/c >> 114 >> n/c  - before dinner: n/c >> 96 >> n/c - 2h after dinner: 205, 255, 297,  303 >> 134, 158 >> n/c - bedtime: n/c >> 97, 140s >> 232 >> n/c - nighttime: n/c >> 114 >> n/c Lowest sugar was 46 >> 200 >> ... 135 >> ?; it is unclear at which CBG level she has hypoglycemia awareness. Highest sugar was 380s >> 183 >> 171 >> 170 >> ?Marland Kitchen  Glucometer: UltraTrak >> Easy Touch  -No CKD but she does have microalbuminuria, last evaluation: Comp Metabolic Panel   [id number]    Albumin 4.3   3.4-4.8  ALP 104   38-126  ALT 13   0-52  Anion Gap 9.8   6.0-20.0  AST 11   0-39  BUN 10   6-26  CO2 36   22-32  CA-corrected 9.15   8.60-10.30  Calcium 9.5   8.6-10.3  Chloride 98   98-107  Creatinine 0.45   0.60-1.30  eGFR2021 111   >60  Glucose 176   70-99  Potassium 3.9   3.5-5.5  Sodium 140   136-145  Protein, Total 7.1   6.0-8.3  TBIL 1.0   0.3-1.0   Lab Results  Component Value Date   BUN 10 07/14/2021   CREATININE 0.45 07/14/2021   Lab Results  Component Value Date   MICRALBCREAT 4.5 02/28/2019  02/24/2021: ACR 109.5 03/31/2020: ACR 128.8 12/26/2019: Glucose 135, BUN/creatinine  13/0.46, GFR 140 10/06/2017: Glucose 126, BUN/creatinine 11/0.43, GFR 153, ACR 71.6 02/17/2016: ACR 66.8 07/31/2015: 14/0.41, eGFR 162 01/29/2015: ACR 31.6. On lisinopril.  -+ HL; last set of lipids:  2021-12-10     LDL Chol Calc (NIH) 105   0-99  CHOL/HDL 3.0   2.0-4.0  Cholesterol 185   <200  HDLD 62   30-85  LDL Chol Calc (NIH) 105   0-99  NHDL 123   0-129  Triglyceride 96   0-199   02/24/2021: 216/75/64/139 12/26/2019: 216/88/62/139 Lab Results  Component Value Date   CHOL 203 (H) 02/28/2019   HDL 52.00 02/28/2019   LDLCALC 130 (H) 02/28/2019   TRIG 105.0 02/28/2019   CHOLHDL 4 02/28/2019  10/06/2017: 203/112/49/132 04/13/2017: 213/104/54/139 07/31/2015: 190/75/54/120 09/17/2014: 183/109/47/114 04/11/2014: 189/87/48/124  She refused statins. Now Lipitor 10 mg daily >> just started).  - last eye exam was on 03/2021: + DR, was getting IO injections - not during the pandemic  or now.  - + numbness and tingling in her feet.  Last foot exam 07/2021.  ROS:  + See HPI  Past Medical History:  Diagnosis Date   Complication of anesthesia    agitation upon waking up as a child .    Depression    Diabetes mellitus    oral meds for diabetes . Type 2   Hypertension    Snores    Past Surgical History:  Procedure Laterality Date   BREAST CYST ASPIRATION     CESAREAN SECTION  01/16/1998   HERNIA REPAIR  2013   I & D EXTREMITY     INSERTION OF MESH  04/20/2012   Procedure: INSERTION OF MESH;  Surgeon: Adin Hector, MD;  Location: [place];  Service: General;  Laterality: N/A;   MASS EXCISION Right 04/19/2016   Procedure: RIGHT SMALL FINGER EXCISION MASS;  Surgeon: Daryll Brod, MD;  Location: [place];  Service: Orthopedics;  Laterality: Right;   TONSILLECTOMY AND ADENOIDECTOMY  age 61   VENTRAL HERNIA REPAIR  04/20/2012   Procedure: LAPAROSCOPIC VENTRAL HERNIA;  Surgeon: Adin Hector, MD;  Location: [place];  Service: General;  Laterality: N/A;  laparoscopic ventral wall hernia repair   VENTRAL HERNIA REPAIR  04/20/2012   Procedure: HERNIA REPAIR VENTRAL ADULT;  Surgeon: Adin Hector, MD;  Location: [place];  Service: General;  Laterality: N/A;  Repair incarcerated ventral hernia times two.   History   Social History   Marital Status: Married    Spouse Name: N/A   Number of Children: 1   Occupational History    Marketing executive   Social History Main Topics   Smoking status: Never Smoker    Smokeless tobacco: Never Used   Alcohol Use: Yes     Comment: occasionally - socially -   Wine or vodka once or twice a month , 2-3 drinks   Drug Use: No   Current Outpatient Medications on File Prior to Visit  Medication Sig Dispense Refill   atorvastatin (LIPITOR) 10 MG tablet Take 5 mg by mouth at bedtime.     Continuous Blood Gluc Sensor (FREESTYLE LIBRE 3 SENSOR) MISC 1 each by Does not apply route every 14 (fourteen) days. 6 each 3    escitalopram (LEXAPRO) 10 MG tablet Take 10 mg by mouth daily.     Insulin Pen Needle 32G X 4 MM MISC Use 1x a day 100 each 3   lisinopril-hydrochlorothiazide (PRINZIDE,ZESTORETIC) 20-25 MG tablet Take 1 tablet by mouth daily.  metFORMIN (GLUCOPHAGE) 1000 MG tablet 2 tablets     OZEMPIC, 0.25 OR 0.5 MG/DOSE, 2 MG/3ML SOPN INJECT 0.5 MGS SUBCUTANEOUSLY ONCE WEEKLY 4.5 mL 3   Semaglutide,0.25 or 0.5MG/DOS, (OZEMPIC, 0.25 OR 0.5 MG/DOSE,) 2 MG/1.5ML SOPN Inject 0.5 mg into the skin once a week. 4.5 mL 3   TOUJEO MAX SOLOSTAR 300 UNIT/ML Solostar Pen INJECT 14 UNITS INTO THE SKIN ONCE DAILYAT BEDTIME (Patient taking differently: 20 Units. INJECT 14 UNITS INTO THE SKIN ONCE DAILYAT BEDTIME) 6 mL 1   Vitamin D, Ergocalciferol, (DRISDOL) 1.25 MG (50000 UNIT) CAPS capsule Take 50,000 Units by mouth once a week.     No current facility-administered medications on file prior to visit.   Allergies  Allergen Reactions   Penicillins Hives    Tolerates keflex   Family History  Problem Relation Age of Onset   Hypertension Mother    Hyperlipidemia Mother    Diabetes Maternal Grandmother    Heart disease Maternal Grandfather    Hyperlipidemia Maternal Grandfather    PE: BP 120/76 (BP Location: Right Arm, Patient Position: Sitting, Cuff Size: Normal)   Pulse 77   Ht 5' 0.08" (1.526 m)   Wt 233 lb 6.4 oz (105.9 kg)   LMP 11/12/2015   SpO2 99%   BMI 45.46 kg/m   Wt Readings from Last 3 Encounters:  12/14/21 233 lb 6.4 oz (105.9 kg)  10/14/21 231 lb 12.8 oz (105.1 kg)  08/26/21 232 lb (105.2 kg)   Constitutional: overweight, in NAD Eyes: PERRLA, EOMI, no exophthalmos ENT: moist mucous membranes, no thyromegaly, no cervical lymphadenopathy Cardiovascular: RRR, No MRG Respiratory: CTA B Musculoskeletal: no deformities Skin: moist, warm, no rashes Neurological: no tremor with outstretched hands  ASSESSMENT: 1. DM2,  insulin-dependent, uncontrolled, with complications -Cardiomegaly  No  family history of medullary thyroid cancer or personal history of pancreatitis.  2. Obesity class 3  3. HL  PLAN:  1. Patient with longstanding, uncontrolled, type 2 diabetes, on metformin, weekly GLP-1 receptor agonist and long-acting insulin, with incomplete compliance with her medications.  At last visit, we increased her metformin and Toujeo dose.  Sugars were above target in the morning and she was not checking later in the day.  Upon questioning, she had to decrease the dose of metformin in an effort to take less medications.  She was only taking the morning dose.  She also did not increase the dose of Toujeo as advised at the previous visit.  She did not start the freestyle libre CGM and I again advised her to start it.  HbA1c at last visit was 8.3%, higher. -At today's visit, she is not checking any sugars!  She does have a CGM with her, however, she did not start it.  I attached it for her today and advised her how to start checking blood sugars, which application to use, and how to connect to our clinic. -Based on the increased HbA1c, I did advise her to increase her Ozempic and T[place]hia dose at today's visit.  She is using a lower dose of Toujeo than recommended at last visit.  She mentions that she also missed some Ozempic doses. -I am hoping that attaching the CGM will give her more closure and will induce some behavioral changes allowing [place] to better control her diabetes.  As of now, she appears to be disengaged from her diabetes care and until this is remedied, it would be very difficult to control it. - I suggested to:  Patient Instructions  Please  continue: - Metformin ER 1000 mg 2x a day  Please increase: - Ozempic 1 mg weekly in a.m.  - Toujeo 28 units daily  Please return in 3 months.  - we checked her HbA1c: 9.2% (higher) - advised to check sugars at different times of the day - 4x a day, rotating check times - advised for yearly eye exams >> she is not UTD - return to  clinic in 3-4 months  2. Obesity class 3 -Now continues on Ozempic, which she previously refused -She lost 6 pounds before last visit -she gained 1 lb since last OV  3. HL - Reviewed latest lipid panel from 01/2021.  LDL was above target, the rest of the fractions at goal.  Triglycerides are improved -She repeatedly refused statins in the past -However, since last visit she met with Dr. Debara Pickett in 09/2021 and he strongly recommended a statin.  He finally started Lipitor, increased from 5 mg to 10 mg daily in the last few days.  Philemon Kingdom, MD PhD [facility] Endocrinology

## 2021-12-14 NOTE — Patient Instructions (Addendum)
Please continue: - Metformin ER 1000 mg 2x a day  Please increase: - Ozempic 1 mg weekly in a.m.  - Toujeo 28 units daily  Please return in 3 months.

## 2021-12-14 NOTE — Addendum Note (Signed)
Addended by: Lauralyn Primes on: 12/14/2021 02:14 PM   Modules accepted: Orders

## 2021-12-15 ENCOUNTER — Telehealth: Payer: Self-pay | Admitting: Nutrition

## 2022-01-04 ENCOUNTER — Encounter: Payer: BC Managed Care – PPO | Attending: Internal Medicine | Admitting: Nutrition

## 2022-01-04 DIAGNOSIS — E1165 Type 2 diabetes mellitus with hyperglycemia: Secondary | ICD-10-CM | POA: Insufficient documentation

## 2022-01-04 NOTE — Patient Instructions (Signed)
Change sensor every 14 days Call [place] help line if sensor falls off, or if problems with the sensor

## 2022-01-04 NOTE — Progress Notes (Signed)
Patient was trained on the use of the [place] 3 sensor.  The app was downloaded to her phone and the readings were linked to [place]. She was trained on how to prep the skin, attach the sensor and start the sensor.  She reported good understanding of this and had no final questions.

## 2022-01-20 DIAGNOSIS — L820 Inflamed seborrheic keratosis: Secondary | ICD-10-CM | POA: Diagnosis not present

## 2022-02-25 DIAGNOSIS — Z0000 Encounter for general adult medical examination without abnormal findings: Secondary | ICD-10-CM | POA: Diagnosis not present

## 2022-02-25 DIAGNOSIS — I10 Essential (primary) hypertension: Secondary | ICD-10-CM | POA: Diagnosis not present

## 2022-02-25 DIAGNOSIS — K760 Fatty (change of) liver, not elsewhere classified: Secondary | ICD-10-CM | POA: Diagnosis not present

## 2022-02-25 DIAGNOSIS — E7800 Pure hypercholesterolemia, unspecified: Secondary | ICD-10-CM | POA: Diagnosis not present

## 2022-02-25 DIAGNOSIS — Z23 Encounter for immunization: Secondary | ICD-10-CM | POA: Diagnosis not present

## 2022-02-25 DIAGNOSIS — I700 Atherosclerosis of aorta: Secondary | ICD-10-CM | POA: Diagnosis not present

## 2022-02-25 DIAGNOSIS — E1169 Type 2 diabetes mellitus with other specified complication: Secondary | ICD-10-CM | POA: Diagnosis not present

## 2022-03-08 ENCOUNTER — Encounter: Payer: Self-pay | Admitting: Internal Medicine

## 2022-03-08 DIAGNOSIS — E1165 Type 2 diabetes mellitus with hyperglycemia: Secondary | ICD-10-CM

## 2022-03-08 MED ORDER — TOUJEO MAX SOLOSTAR 300 UNIT/ML ~~LOC~~ SOPN: 28.0000 [IU] | PEN_INJECTOR | Freq: Every day | SUBCUTANEOUS | 3 refills | Status: DC

## 2022-03-11 ENCOUNTER — Other Ambulatory Visit: Payer: Self-pay | Admitting: Internal Medicine

## 2022-03-11 DIAGNOSIS — E1165 Type 2 diabetes mellitus with hyperglycemia: Secondary | ICD-10-CM

## 2022-03-18 ENCOUNTER — Other Ambulatory Visit: Payer: Self-pay | Admitting: Internal Medicine

## 2022-03-24 ENCOUNTER — Ambulatory Visit: Payer: BC Managed Care – PPO | Admitting: Internal Medicine

## 2022-04-29 ENCOUNTER — Other Ambulatory Visit: Payer: Self-pay | Admitting: Internal Medicine

## 2022-04-29 ENCOUNTER — Encounter: Payer: Self-pay | Admitting: Internal Medicine

## 2022-04-29 MED ORDER — SEMAGLUTIDE (1 MG/DOSE) 4 MG/3ML ~~LOC~~ SOPN: 1.0000 mg | PEN_INJECTOR | SUBCUTANEOUS | 3 refills | Status: DC

## 2022-04-29 MED ORDER — FREESTYLE LIBRE 3 SENSOR MISC: 1.0000 | 3 refills | Status: DC

## 2022-05-31 ENCOUNTER — Encounter: Payer: Self-pay | Admitting: Internal Medicine

## 2022-06-02 ENCOUNTER — Ambulatory Visit: Payer: BC Managed Care – PPO | Admitting: Internal Medicine

## 2022-06-02 DIAGNOSIS — E7800 Pure hypercholesterolemia, unspecified: Secondary | ICD-10-CM | POA: Diagnosis not present

## 2022-06-13 NOTE — Telephone Encounter (Signed)
Appt. Schedule for tomorrow

## 2022-07-29 ENCOUNTER — Ambulatory Visit: Payer: BC Managed Care – PPO | Admitting: Internal Medicine

## 2022-07-29 ENCOUNTER — Encounter: Payer: Self-pay | Admitting: Internal Medicine

## 2022-07-29 VITALS — BP 136/84 | HR 87 | Ht 60.08 in | Wt 232.6 lb

## 2022-07-29 DIAGNOSIS — Z794 Long term (current) use of insulin: Secondary | ICD-10-CM | POA: Diagnosis not present

## 2022-07-29 DIAGNOSIS — E785 Hyperlipidemia, unspecified: Secondary | ICD-10-CM | POA: Diagnosis not present

## 2022-07-29 DIAGNOSIS — N912 Amenorrhea, unspecified: Secondary | ICD-10-CM | POA: Diagnosis not present

## 2022-07-29 DIAGNOSIS — E559 Vitamin D deficiency, unspecified: Secondary | ICD-10-CM | POA: Diagnosis not present

## 2022-07-29 DIAGNOSIS — E1165 Type 2 diabetes mellitus with hyperglycemia: Secondary | ICD-10-CM

## 2022-07-29 DIAGNOSIS — Z6841 Body Mass Index (BMI) 40.0 and over, adult: Secondary | ICD-10-CM | POA: Diagnosis not present

## 2022-07-29 DIAGNOSIS — Z01419 Encounter for gynecological examination (general) (routine) without abnormal findings: Secondary | ICD-10-CM | POA: Diagnosis not present

## 2022-07-29 LAB — POCT GLYCOSYLATED HEMOGLOBIN (HGB A1C): Hemoglobin A1C: 8.6 % — AB (ref 4.0–5.6)

## 2022-07-29 MED ORDER — SEMAGLUTIDE (1 MG/DOSE) 4 MG/3ML ~~LOC~~ SOPN
1.0000 mg | PEN_INJECTOR | SUBCUTANEOUS | 3 refills | Status: DC
Start: 2022-07-29 — End: 2023-07-03

## 2022-07-29 NOTE — Progress Notes (Signed)
Patient ID: Colleen Henry, female   DOB: May 30, 1961, 62 y.o.   MRN: [id number]  HPI: Colleen Henry is a 62 y.o.-year-old female, returning for f/u for DM2, dx in ~2011, insulin-dependent since 10/2019, uncontrolled, without complications. Last visit 7 months ago.  Interim history: No increased urination, blurry vision, nausea, chest pain.  She previously had gas pains from Ozempic - Mylanta helping >> resolved. However, she was off Ozempic for 2 weeks as she could not refill it.  Reviewed HbA1c levels: Lab Results  Component Value Date   HGBA1C 9.2 (A) 12/14/2021   HGBA1C 8.3 (A) 08/12/2021   HGBA1C 8.0 (A) 04/02/2021  11/2014: HbA1c 9.9% 09/17/2014: HbA1c 12.2% 04/11/2014: HbA1c 8.6%  Pt was on a regimen of: - Metformin XR 1000 mg bid - cannot remember what happened with reg metformin - Farxiga 10 mg daily in am - started 07/2015 >> switched to Invokana but ran out  - Biocell: Biotin, Zinc, Chromium She stopped Amaryl - was taking 2 mg before b'fast Insulin was suggested to her >> too expensive 300$/mo  At last visit she was on: - Metformin ER 1000 mg 2x a day >> 1000 mg in am (not taking the evening dose in last 2 mo) - Toujeo 14 units daily  - added 10/2019 >> off 08/2020 >> restarted at 20 >>  (she forgot) - Ozempic 0.25 mg weekly-added 08/2020 >> 0.5 mg weekly Refused Trulicity in the past, including 03/31/2020 Previously on glipizide 5 mg twice a day. Tyler Aas was not covered for her.  We changed the regimen: - Metformin ER 1000 mg 2x a day - Ozempic 0.5 >> 1 mg weekly in a.m. - off for 2 weeks - Toujeo 24 >> 20 >> 28 units daily  She is not checking blood sugars now.  She checked with the freestyle libre CGM until 2 months ago -but did not attach it afterwards:   Previously: - am:  250s-300s >> 113-157, 169, 183 >> 115-150, 162, 171 >> 135-170 - 2h after b'fast: n/c - before lunch: n/c - 2h after lunch: 170 >> n/c >> 114 >> n/c  - before dinner: n/c >> 96 >>  n/c - 2h after dinner: 205, 255, 297, 303 >> 134, 158 >> n/c - bedtime: n/c >> 97, 140s >> 232 >> n/c - nighttime: n/c >> 114 >> n/c Lowest sugar was 46 >> 200 >> ... 135 >> ?; it is unclear at which CBG level she has hypoglycemia awareness. Highest sugar was 380s >> 183 >> 171 >> 170 >> ?Marland Kitchen  Glucometer: UltraTrak >> Easy Touch  -No CKD but she does have microalbuminuria, last evaluation: Comp Metabolic Panel   [id number]    Albumin 4.3   3.4-4.8  ALP 104   38-126  ALT 13   0-52  Anion Gap 9.8   6.0-20.0  AST 11   0-39  BUN 10   6-26  CO2 36   22-32  CA-corrected 9.15   8.60-10.30  Calcium 9.5   8.6-10.3  Chloride 98   98-107  Creatinine 0.45   0.60-1.30  eGFR2021 111   >60  Glucose 176   70-99  Potassium 3.9   3.5-5.5  Sodium 140   136-145  Protein, Total 7.1   6.0-8.3  TBIL 1.0   0.3-1.0   Lab Results  Component Value Date   BUN 10 07/14/2021   CREATININE 0.45 07/14/2021   Lab Results  Component Value Date   MICRALBCREAT 4.5 02/28/2019  02/24/2021: ACR 109.5  03/31/2020: ACR 128.8 12/26/2019: Glucose 135, BUN/creatinine 13/0.46, GFR 140 10/06/2017: Glucose 126, BUN/creatinine 11/0.43, GFR 153, ACR 71.6 02/17/2016: ACR 66.8 07/31/2015: 14/0.41, eGFR 162 01/29/2015: ACR 31.6. On lisinopril.  -+ HL; last set of lipids:  2021-12-10     LDL Chol Calc (NIH) 105   0-99  CHOL/HDL 3.0   2.0-4.0  Cholesterol 185   <200  HDLD 62   30-85  LDL Chol Calc (NIH) 105   0-99  NHDL 123   0-129  Triglyceride 96   0-199   02/24/2021: 216/75/64/139 12/26/2019: 216/88/62/139 Lab Results  Component Value Date   CHOL 203 (H) 02/28/2019   HDL 52.00 02/28/2019   LDLCALC 130 (H) 02/28/2019   TRIG 105.0 02/28/2019   CHOLHDL 4 02/28/2019  10/06/2017: 203/112/49/132 04/13/2017: 213/104/54/139 07/31/2015: 190/75/54/120 09/17/2014: 183/109/47/114 04/11/2014: 189/87/48/124  She refused statins. Now Lipitor 10 mg daily >> just started).  - last eye exam was on 03/2021: + DR, was getting  IO injections -stopped during the pandemic. Coming up.  - + numbness and tingling in her feet.  Last foot exam 08/12/2021.  ROS:  + See HPI  Past Medical History:  Diagnosis Date   Complication of anesthesia    agitation upon waking up as a child .    Depression    Diabetes mellitus    oral meds for diabetes . Type 2   Hypertension    Snores    Past Surgical History:  Procedure Laterality Date   BREAST CYST ASPIRATION     CESAREAN SECTION  01/16/1998   HERNIA REPAIR  2013   I & D EXTREMITY     INSERTION OF MESH  04/20/2012   Procedure: INSERTION OF MESH;  Surgeon: Adin Hector, MD;  Location: [facility];  Service: General;  Laterality: N/A;   MASS EXCISION Right 04/19/2016   Procedure: RIGHT SMALL FINGER EXCISION MASS;  Surgeon: Daryll Brod, MD;  Location: [facility];  Service: Orthopedics;  Laterality: Right;   TONSILLECTOMY AND ADENOIDECTOMY  age 50   VENTRAL HERNIA REPAIR  04/20/2012   Procedure: LAPAROSCOPIC VENTRAL HERNIA;  Surgeon: Adin Hector, MD;  Location: [place];  Service: General;  Laterality: N/A;  laparoscopic ventral wall hernia repair   VENTRAL HERNIA REPAIR  04/20/2012   Procedure: HERNIA REPAIR VENTRAL ADULT;  Surgeon: Adin Hector, MD;  Location: [place];  Service: General;  Laterality: N/A;  Repair incarcerated ventral hernia times two.   History   Social History   Marital Status: Married    Spouse Name: N/A   Number of Children: 1   Occupational History    Marketing executive   Social History Main Topics   Smoking status: Never Smoker    Smokeless tobacco: Never Used   Alcohol Use: Yes     Comment: occasionally - socially -   Wine or vodka once or twice a month , 2-3 drinks   Drug Use: No   Current Outpatient Medications on File Prior to Visit  Medication Sig Dispense Refill   atorvastatin (LIPITOR) 10 MG tablet Take 5 mg by mouth at bedtime.     Continuous Blood Gluc Sensor (FREESTYLE LIBRE 3 SENSOR) MISC 1 each by Does not  apply route every 14 (fourteen) days. 6 each 3   escitalopram (LEXAPRO) 10 MG tablet Take 10 mg by mouth daily.     insulin glargine, 2 Unit Dial, (TOUJEO MAX SOLOSTAR) 300 UNIT/ML Solostar Pen Inject 28 Units into the skin daily. INJECT 28 UNITS  INTO THE SKIN ONCE DAILY 9 mL 3   Insulin Pen Needle 32G X 4 MM MISC Use 1x a day 100 each 3   lisinopril-hydrochlorothiazide (PRINZIDE,ZESTORETIC) 20-25 MG tablet Take 1 tablet by mouth daily.     metFORMIN (GLUCOPHAGE) 1000 MG tablet 2 tablets     metFORMIN (GLUCOPHAGE-XR) 500 MG 24 hr tablet TAKE 2 TABLETS BY MOUTH TWICE DAILY WITH A MEAL 360 tablet 1   Semaglutide, 1 MG/DOSE, 4 MG/3ML SOPN Inject 1 mg as directed once a week. 9 mL 3   Vitamin D, Ergocalciferol, (DRISDOL) 1.25 MG (50000 UNIT) CAPS capsule Take 50,000 Units by mouth once a week.     No current facility-administered medications on file prior to visit.   Allergies  Allergen Reactions   Penicillins Hives    Tolerates keflex   Family History  Problem Relation Age of Onset   Hypertension Mother    Hyperlipidemia Mother    Diabetes Maternal Grandmother    Heart disease Maternal Grandfather    Hyperlipidemia Maternal Grandfather    PE: BP 136/84 (BP Location: Right Arm, Patient Position: Sitting, Cuff Size: Normal)   Pulse 87   Ht 5' 0.08" (1.526 m)   Wt 232 lb 9.6 oz (105.5 kg)   LMP 11/12/2015   SpO2 98%   BMI 45.31 kg/m   Wt Readings from Last 3 Encounters:  07/29/22 232 lb 9.6 oz (105.5 kg)  12/14/21 233 lb 6.4 oz (105.9 kg)  10/14/21 231 lb 12.8 oz (105.1 kg)   Constitutional: overweight, in NAD Eyes: EOMI, no exophthalmos ENT: no thyromegaly, no cervical lymphadenopathy Cardiovascular: RRR, No MRG Respiratory: CTA B Musculoskeletal: no deformities Skin: no rashes Neurological: no tremor with outstretched hands  ASSESSMENT: 1. DM2,  insulin-dependent, uncontrolled, with complications -Cardiomegaly  No family history of medullary thyroid cancer or personal  history of pancreatitis.  2. Obesity class 3  3. HL  PLAN:  1. Patient with longstanding, type 2 diabetes, on metformin, weekly GLP-1 receptor agonist and long-acting insulin, with intermittent compliance with medications, and blood sugar checks and subsequent poor diabetes control.  At last visit, she was not checking blood sugars at all.  She had a CGM but did not have it attached.  I attached it for her at last visit.  I advised her how to start checking blood sugars, which application to use, and how to connect our clinic.  HbA1c was higher at that time, at 9.2%.  We increased the Toujeo and Ozempic doses.  Unfortunately, she appeared to be disengaged from her diabetes care and until this is remedied, it would be very difficult to control it. CGM interpretation: -At today's visit, we reviewed her CGM downloads from 2 months ago: It appears that 73% of values are in target range (goal >70%), while 27% are higher than 180 (goal <25%), and 0% are lower than 70 (goal <4%).  The calculated average blood sugar is 161.  The projected HbA1c for the next 3 months (GMI) is 7.2%. -Reviewing the CGM trends, sugars are fluctuating within the target range mostly, with hyperglycemic spikes after lunch and especially after dinner.  After this meal, the majority of the blood sugars are elevated.  We discussed about the need to adjust dinner but I also recommended possibly to add back glipizide or even to use mealtime insulin.  She would not want to add another medicine for now but to work on her diet. -Therefore, for now, we will continue metformin and Toujeo and I advised  her to restart Ozempic, and I sent the new prescription for this to her pharmacy.  I strongly advised her to attach the CGM again.  In fact, I attached it for her at today's visit.  I demonstrated how  to choose the insertion site and to apply the sensor. - I suggested to:  Patient Instructions  Please continue: - Metformin ER 1000 mg 2x a  day - Toujeo 28 units daily  Restart: - Ozempic 1 mg weekly in a.m.   Please return in 3-4 months.  - we checked her HbA1c: 8.6% (slightly lower) - advised to check sugars at different times of the day - 4x a day, rotating check times - advised for yearly eye exams >> she is UTD - return to clinic in 3-4 months  2. Obesity class 3 -She continues on Ozempic, which she tolerates well.  Dose was increased  at last visit. -She is now off Ozempic for 2 weeks.  Will restart this. -At today's visit, weight is approximately stable  3. HL - Reviewed latest lipid panel from 11/2021: LDL above target (105), the rest of the fractions at goal.  Triglycerides were improved - she initially refused statins repeatedly, but Dr. Debara Pickett started her on Lipitor in 09/2021, initially 5, now 10 mg daily.  She tolerates this well.  Philemon Kingdom, MD PhD [facility] Endocrinology

## 2022-07-29 NOTE — Patient Instructions (Addendum)
Please continue: - Metformin ER 1000 mg 2x a day - Toujeo 28 units daily  Restart: - Ozempic 1 mg weekly in a.m.   Please return in 3-4 months.

## 2022-08-04 DIAGNOSIS — E113292 Type 2 diabetes mellitus with mild nonproliferative diabetic retinopathy without macular edema, left eye: Secondary | ICD-10-CM | POA: Diagnosis not present

## 2022-08-18 DIAGNOSIS — L570 Actinic keratosis: Secondary | ICD-10-CM | POA: Diagnosis not present

## 2022-08-18 DIAGNOSIS — L814 Other melanin hyperpigmentation: Secondary | ICD-10-CM | POA: Diagnosis not present

## 2022-08-18 DIAGNOSIS — D225 Melanocytic nevi of trunk: Secondary | ICD-10-CM | POA: Diagnosis not present

## 2022-08-18 DIAGNOSIS — L282 Other prurigo: Secondary | ICD-10-CM | POA: Diagnosis not present

## 2022-08-18 DIAGNOSIS — L578 Other skin changes due to chronic exposure to nonionizing radiation: Secondary | ICD-10-CM | POA: Diagnosis not present

## 2022-08-25 ENCOUNTER — Other Ambulatory Visit: Payer: Self-pay | Admitting: Obstetrics and Gynecology

## 2022-08-25 DIAGNOSIS — Z1231 Encounter for screening mammogram for malignant neoplasm of breast: Secondary | ICD-10-CM

## 2022-09-29 ENCOUNTER — Ambulatory Visit
Admission: RE | Admit: 2022-09-29 | Discharge: 2022-09-29 | Disposition: A | Discharge status: Home / Self Care | Payer: BC Managed Care – PPO | Source: Ambulatory Visit | Attending: Obstetrics and Gynecology | Admitting: Obstetrics and Gynecology

## 2022-09-29 DIAGNOSIS — Z1231 Encounter for screening mammogram for malignant neoplasm of breast: Secondary | ICD-10-CM | POA: Diagnosis not present

## 2022-10-20 ENCOUNTER — Other Ambulatory Visit: Payer: Self-pay | Admitting: Internal Medicine

## 2022-11-10 IMAGING — MG MM DIGITAL DIAGNOSTIC UNILAT*R* W/ TOMO W/ CAD
4 series · 4 of 12 positions shown · non-contrast
Comparison: Previous exam(s).

CLINICAL DATA: Patient presents with waxing and waning shooting
type pain along the upper to upper outer right breast. No reported
lumps.

EXAM:
DIGITAL DIAGNOSTIC UNILATERAL RIGHT MAMMOGRAM WITH TOMO AND CAD
TECHNIQUE: Right digital diagnostic mammography and breast tomosynthesis was
performed. Digital images of the breasts were evaluated with
computer-aided detection.

[R MLO synth-2D]
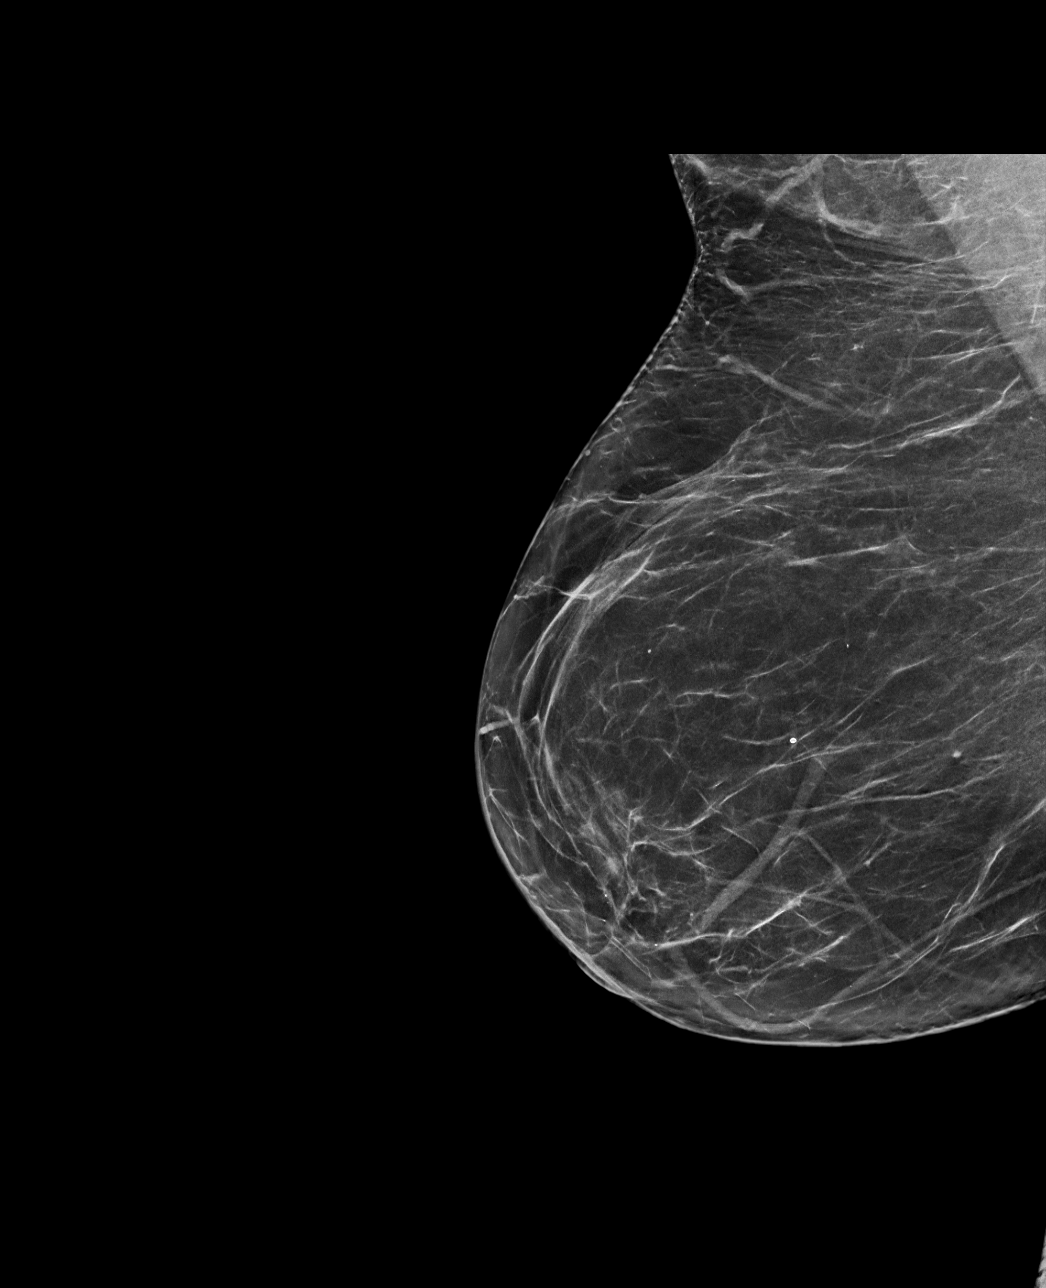

[R CC synth-2D]
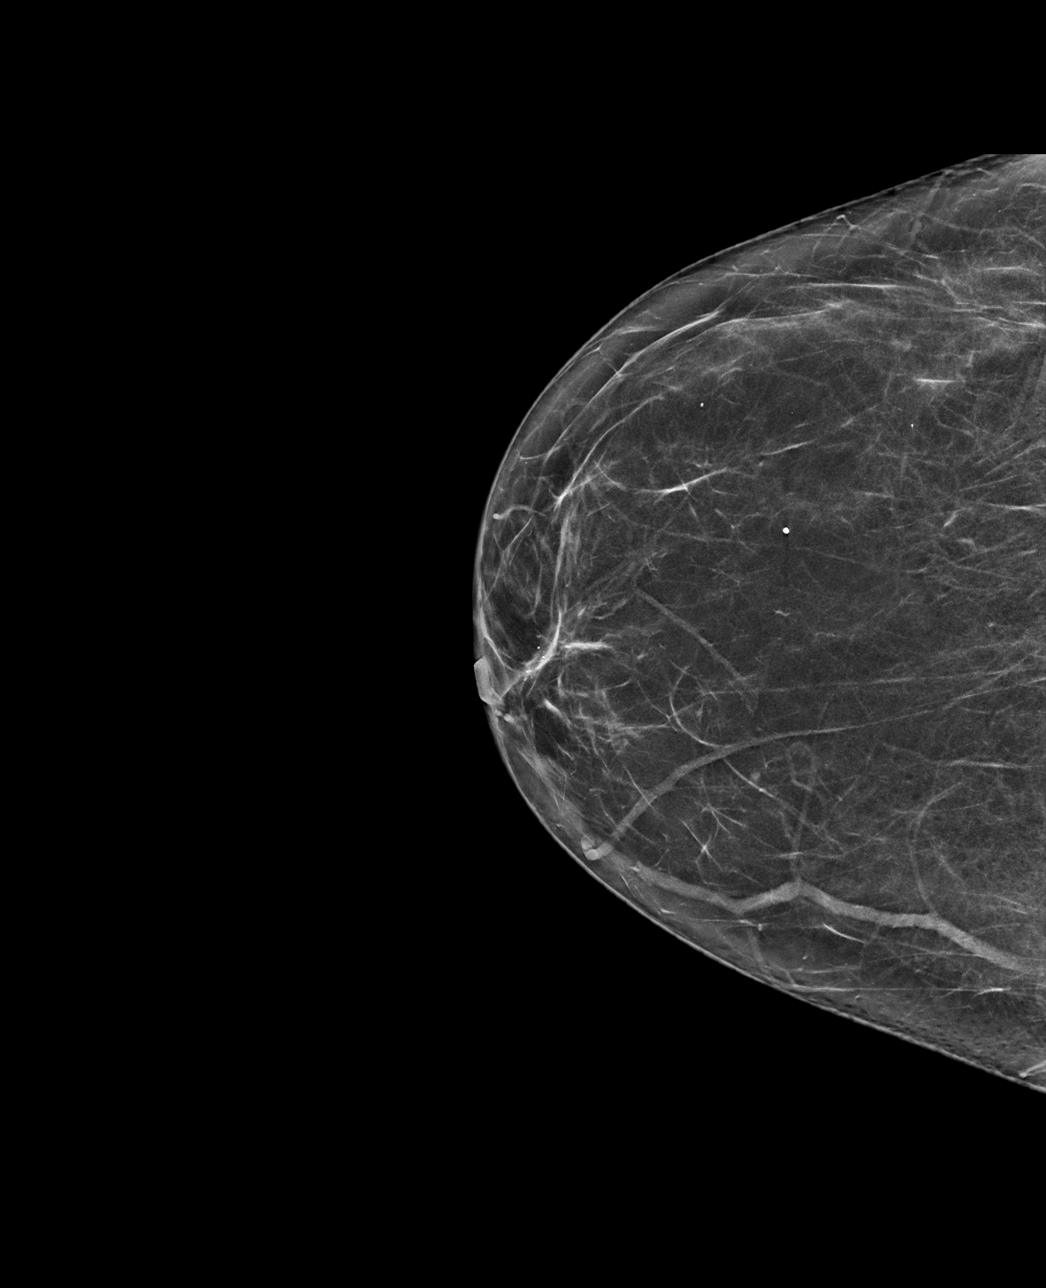

[R CC tomo · tomo slice 33/66.0]
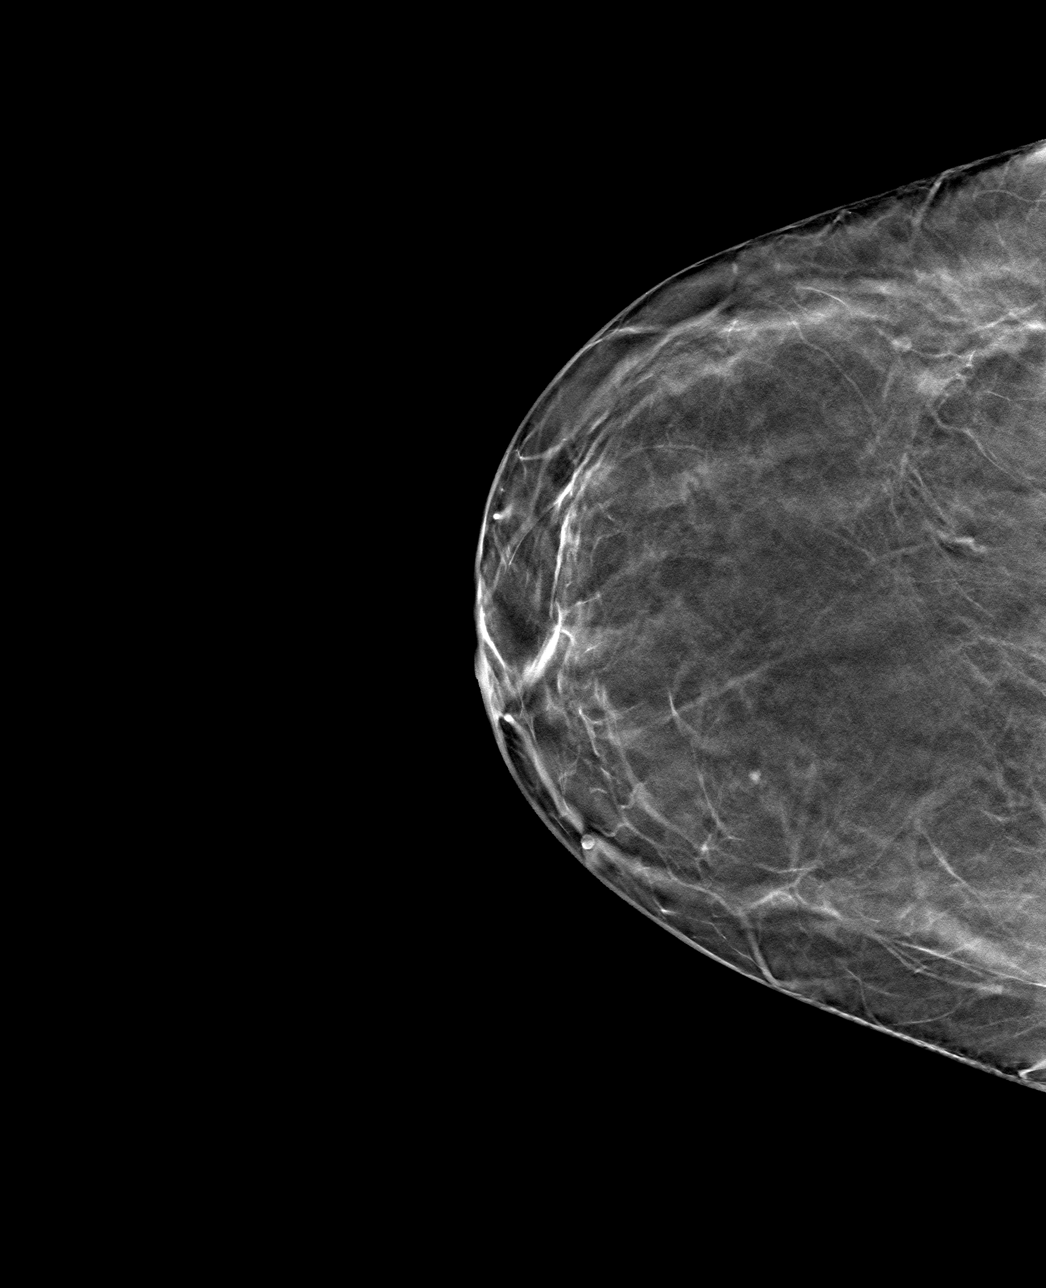

[R MLO tomo · tomo slice 39/78.0]
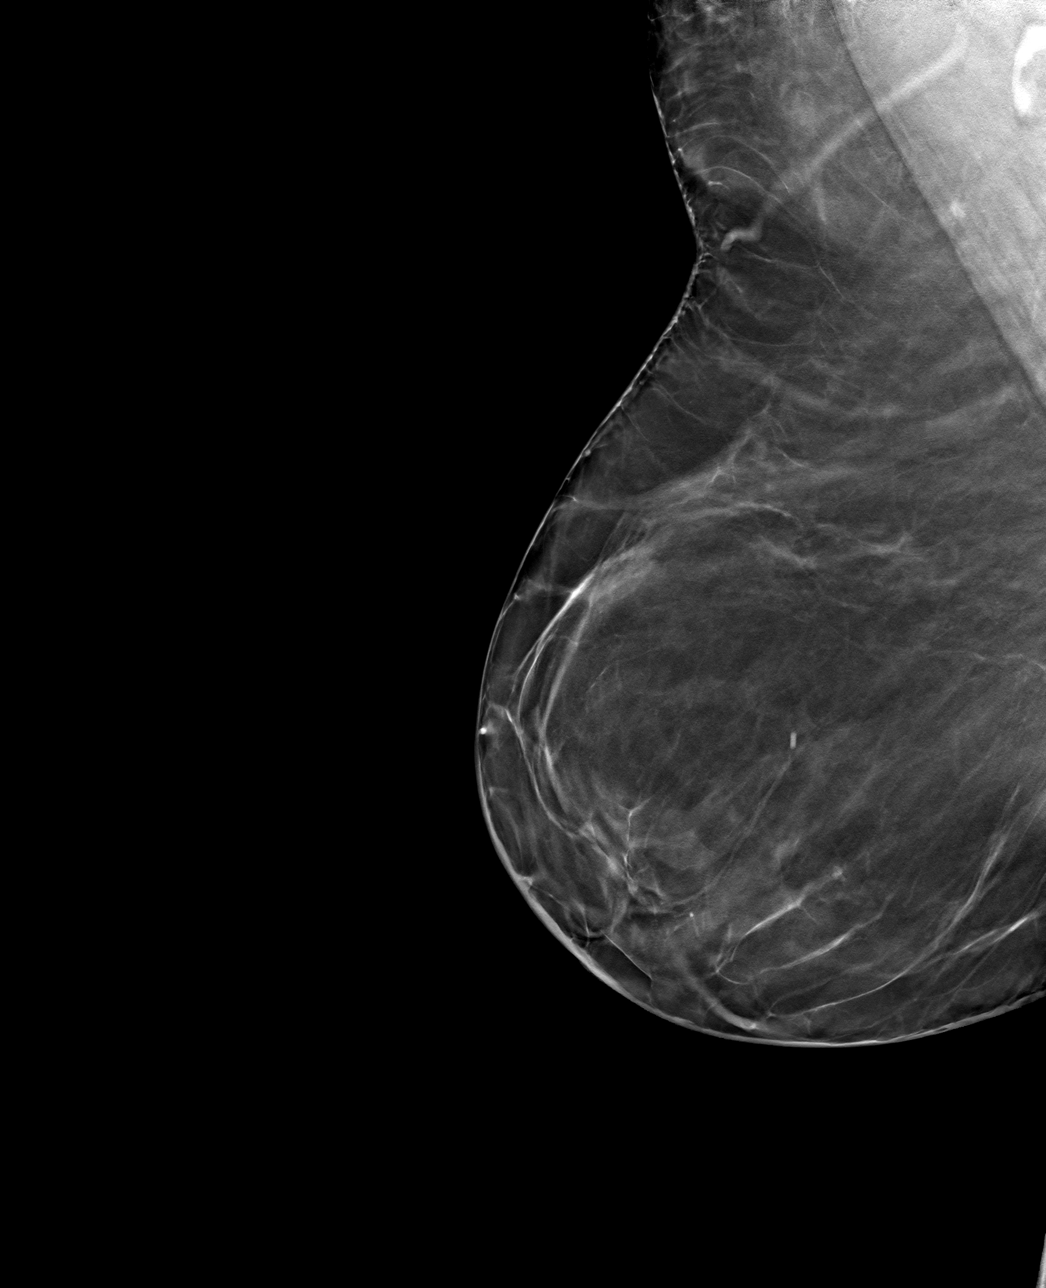

[4 of 12 positions shown; findings below may reference images not displayed]

ACR Breast Density Category b: There are scattered areas of
fibroglandular density.
FINDINGS: There are no masses, areas of architectural distortion areas of
significant asymmetry or suspicious calcifications. No mammographic
change.
IMPRESSION: No evidence of breast malignancy.

RECOMMENDATION:
Return to routine annual screening mammography.

I have discussed the findings and recommendations with the patient.
If applicable, a reminder letter will be sent to the patient
regarding the next appointment.

BI-RADS CATEGORY  1: Negative.

## 2022-11-25 ENCOUNTER — Encounter: Payer: Self-pay | Admitting: Internal Medicine

## 2022-11-25 ENCOUNTER — Ambulatory Visit: Payer: BC Managed Care – PPO | Admitting: Internal Medicine

## 2022-11-25 VITALS — BP 138/80 | HR 76 | Ht 60.0 in | Wt 224.6 lb

## 2022-11-25 DIAGNOSIS — E785 Hyperlipidemia, unspecified: Secondary | ICD-10-CM

## 2022-11-25 DIAGNOSIS — E119 Type 2 diabetes mellitus without complications: Secondary | ICD-10-CM

## 2022-11-25 DIAGNOSIS — Z7984 Long term (current) use of oral hypoglycemic drugs: Secondary | ICD-10-CM

## 2022-11-25 DIAGNOSIS — E66813 Obesity, class 3: Secondary | ICD-10-CM

## 2022-11-25 DIAGNOSIS — Z7985 Long-term (current) use of injectable non-insulin antidiabetic drugs: Secondary | ICD-10-CM | POA: Diagnosis not present

## 2022-11-25 DIAGNOSIS — Z794 Long term (current) use of insulin: Secondary | ICD-10-CM

## 2022-11-25 DIAGNOSIS — E1165 Type 2 diabetes mellitus with hyperglycemia: Secondary | ICD-10-CM | POA: Diagnosis not present

## 2022-11-25 LAB — HEMOGLOBIN A1C: Hemoglobin A1C: 7.5

## 2022-11-25 NOTE — Patient Instructions (Addendum)
Please continue: - Metformin ER 1000 mg 2x a day - Toujeo 20 units daily - Ozempic 1 mg weekly in a.m.   Please return in 4 months.

## 2022-11-25 NOTE — Progress Notes (Signed)
Patient ID: Colleen Henry, female   DOB: 03-28-1961, 62 y.o.   MRN: [id number]  HPI: Colleen Henry is a 62 y.o.-year-old female, returning for f/u for DM2, dx in ~2011, insulin-dependent since 10/2019, uncontrolled, without long-term complications. Last visit 4 months ago.  Interim history: No increased urination, blurry vision, nausea, chest pain.  We restarted her CGM at last visit but she stopped 3 weeks later.  She would like to get back on it. She is walking every day in am (30 min), also started dietary changes. She lost 8 lbs since last visit and 1 mg morning preparation for her son's wedding at the end of November.  Reviewed HbA1c levels: Lab Results  Component Value Date   HGBA1C 8.6 (A) 07/29/2022   HGBA1C 9.2 (A) 12/14/2021   HGBA1C 8.3 (A) 08/12/2021  11/2014: HbA1c 9.9% 09/17/2014: HbA1c 12.2% 04/11/2014: HbA1c 8.6%  Pt was on a regimen of: - Metformin XR 1000 mg bid - cannot remember what happened with reg metformin - Farxiga 10 mg daily in am - started 07/2015 >> switched to Invokana but ran out  - Biocell: Biotin, Zinc, Chromium She stopped Amaryl - was taking 2 mg before b'fast Insulin was suggested to her >> too expensive 300$/mo  Then on: - Metformin ER 1000 mg 2x a day >> 1000 mg in am (not taking the evening dose in last 2 mo) - Toujeo 14 units daily  - added 10/2019 >> off 08/2020 >> restarted at 20 >>  (she forgot) - Ozempic 0.25 mg weekly-added 08/2020 >> 0.5 mg weekly Refused Trulicity in the past, including 03/31/2020 Previously on glipizide 5 mg twice a day. Evaristo Bury was not covered for her.  We changed the regimen: - Metformin ER 1000 mg 2x a day - Ozempic 0.5 >> 1 mg weekly in a.m. - Toujeo 24 >> 20 >> 28 units daily  She was checking blood sugars with with the freestyle libre CGM, but off again for the last 3 months. She did not check sugars by fingerstick...  Previously:   Lowest sugar was 46 >> 200 >> ... 135 >> ?; it is unclear at which  CBG level she has hypoglycemia awareness. Highest sugar was 380s >> 183 >> 171 >> 170 >> ?Marland Kitchen  Glucometer: UltraTrak >> Easy Touch  -No CKD but she does have microalbuminuria, last evaluation: Comp Metabolic Panel   [id number]    Albumin 4.3   3.4-4.8  ALP 104   38-126  ALT 13   0-52  Anion Gap 9.8   6.0-20.0  AST 11   0-39  BUN 10   6-26  CO2 36   22-32  CA-corrected 9.15   8.60-10.30  Calcium 9.5   8.6-10.3  Chloride 98   98-107  Creatinine 0.45   0.60-1.30  eGFR2021 111   >60  Glucose 176   70-99  Potassium 3.9   3.5-5.5  Sodium 140   136-145  Protein, Total 7.1   6.0-8.3  TBIL 1.0   0.3-1.0   Lab Results  Component Value Date   BUN 10 07/14/2021   CREATININE 0.45 07/14/2021   Lab Results  Component Value Date   MICRALBCREAT 4.5 02/28/2019  02/24/2021: ACR 109.5 03/31/2020: ACR 128.8 12/26/2019: Glucose 135, BUN/creatinine 13/0.46, GFR 140 10/06/2017: Glucose 126, BUN/creatinine 11/0.43, GFR 153, ACR 71.6 02/17/2016: ACR 66.8 07/31/2015: 14/0.41, eGFR 162 01/29/2015: ACR 31.6. On lisinopril.  -+ HL; last set of lipids: 12/10/2021:185/96/62/105 02/24/2021: 216/75/64/139 12/26/2019: 216/88/62/139 Lab Results  Component Value Date  CHOL 203 (H) 02/28/2019   HDL 52.00 02/28/2019   LDLCALC 130 (H) 02/28/2019   TRIG 105.0 02/28/2019   CHOLHDL 4 02/28/2019  10/06/2017: 203/112/49/132 04/13/2017: 213/104/54/139 07/31/2015: 190/75/54/120 09/17/2014: 183/109/47/114 04/11/2014: 189/87/48/124  She initially refused statins. Now Lipitor 10 mg daily.  - last eye exam was on 03/2021: + DR, was getting IO injections -stopped during the pandemic.  - + numbness and tingling in her feet.  Last foot exam 07/29/2022.  ROS:  + See HPI  Past Medical History:  Diagnosis Date   Complication of anesthesia    agitation upon waking up as a child .    Depression    Diabetes mellitus    oral meds for diabetes . Type 2   Hypertension    Snores    Past Surgical History:   Procedure Laterality Date   BREAST CYST ASPIRATION     CESAREAN SECTION  01/16/1998   HERNIA REPAIR  2013   I & D EXTREMITY     INSERTION OF MESH  04/20/2012   Procedure: INSERTION OF MESH;  Surgeon: Ardeth Sportsman, MD;  Location: [facility];  Service: General;  Laterality: N/A;   MASS EXCISION Right 04/19/2016   Procedure: RIGHT SMALL FINGER EXCISION MASS;  Surgeon: Cindee Salt, MD;  Location: [facility];  Service: Orthopedics;  Laterality: Right;   TONSILLECTOMY AND ADENOIDECTOMY  age 66   VENTRAL HERNIA REPAIR  04/20/2012   Procedure: LAPAROSCOPIC VENTRAL HERNIA;  Surgeon: Ardeth Sportsman, MD;  Location: [facility];  Service: General;  Laterality: N/A;  laparoscopic ventral wall hernia repair   VENTRAL HERNIA REPAIR  04/20/2012   Procedure: HERNIA REPAIR VENTRAL ADULT;  Surgeon: Ardeth Sportsman, MD;  Location: [facility];  Service: General;  Laterality: N/A;  Repair incarcerated ventral hernia times two.   History   Social History   Marital Status: Married    Spouse Name: N/A   Number of Children: 1   Occupational History    Physiological scientist   Social History Main Topics   Smoking status: Never Smoker    Smokeless tobacco: Never Used   Alcohol Use: Yes     Comment: occasionally - socially -   Wine or vodka once or twice a month , 2-3 drinks   Drug Use: No   Current Outpatient Medications on File Prior to Visit  Medication Sig Dispense Refill   atorvastatin (LIPITOR) 10 MG tablet Take 5 mg by mouth at bedtime.     Continuous Glucose Sensor (FREESTYLE LIBRE 3 SENSOR) MISC APPLY EVERY 14 DAYS AS DIRECTED 6 each 3   escitalopram (LEXAPRO) 10 MG tablet Take 10 mg by mouth daily.     insulin glargine, 2 Unit Dial, (TOUJEO MAX SOLOSTAR) 300 UNIT/ML Solostar Pen Inject 28 Units into the skin daily. INJECT 28 UNITS INTO THE SKIN ONCE DAILY 9 mL 3   Insulin Pen Needle 32G X 4 MM MISC Use 1x a day 100 each 3   lisinopril-hydrochlorothiazide (PRINZIDE,ZESTORETIC) 20-25 MG tablet Take  1 tablet by mouth daily.     metFORMIN (GLUCOPHAGE) 1000 MG tablet 2 tablets     metFORMIN (GLUCOPHAGE-XR) 500 MG 24 hr tablet TAKE 2 TABLETS BY MOUTH TWICE DAILY WITH A MEAL 360 tablet 1   Semaglutide, 1 MG/DOSE, 4 MG/3ML SOPN Inject 1 mg as directed once a week. 9 mL 3   Vitamin D, Ergocalciferol, (DRISDOL) 1.25 MG (50000 UNIT) CAPS capsule Take 50,000 Units by mouth once a week.  No current facility-administered medications on file prior to visit.   Allergies  Allergen Reactions   Penicillins Hives    Tolerates keflex   Family History  Problem Relation Age of Onset   Hypertension Mother    Hyperlipidemia Mother    Diabetes Maternal Grandmother    Heart disease Maternal Grandfather    Hyperlipidemia Maternal Grandfather    PE: BP 138/80   Pulse 76   Ht 5' (1.524 m)   Wt 224 lb 9.6 oz (101.9 kg)   LMP 11/12/2015   SpO2 97%   BMI 43.86 kg/m   Wt Readings from Last 3 Encounters:  11/25/22 224 lb 9.6 oz (101.9 kg)  07/29/22 232 lb 9.6 oz (105.5 kg)  12/14/21 233 lb 6.4 oz (105.9 kg)   Constitutional: overweight, in NAD Eyes: EOMI, no exophthalmos ENT: no thyromegaly, no cervical lymphadenopathy Cardiovascular: RRR, No MRG Respiratory: CTA B Musculoskeletal: no deformities Skin: no rashes Neurological: no tremor with outstretched hands  ASSESSMENT: 1. DM2,  insulin-dependent, uncontrolled, with complications -Cardiomegaly  No family history of medullary thyroid cancer or personal history of pancreatitis.  2. Obesity class 3  3. HL  PLAN:  1. Patient with longstanding, type 2 diabetes, on metformin, weekly GLP-1 receptor agonist, and long-acting insulin, with still poor control.  At last visit, HbA1c was lower, at 8.6%, still significantly above target.  Sugars were fluctuating within the target range with hyperglycemic spikes after lunch and especially after dinner.  We discussed about adjusting this meal, and also possibly using glipizide or mealtime insulin to  cover it, but she wanted to work on her diet first.  She was also off the CGM and I advised her to attach it again -in fact, I  attached the sensor for her at last visit.  -At this visit, she is off her sensor after he came off and she did not attach another one.  We attached another libre CGM at today's visit.  She did not check blood sugars manually since last visit.  She tells me that she suspected that her sugars were better if she started to walk every day for the last 2 months and she also started to improve her diet. -At this visit, I did advise her that if she is not using the CGM, to check sugars manually.  For now, we will not change her regimen.  She is taking a lower dose of Toujeo than last visit, which we will continue.  We discussed that if the sugars continue to improve, will continue to decrease the insulin dose and possibly even the metformin. - I suggested to:  Patient Instructions  Please continue: - Metformin ER 1000 mg 2x a day - Toujeo 20 units daily - Ozempic 1 mg weekly in a.m.   Please return in 4 months.  - we checked her HbA1c: 7.5% (lower) - advised to check sugars at different times of the day - 4x a day, rotating check times - advised for yearly eye exams >> she is not UTD - return to clinic in 3-4 months  2. Obesity class 3 -At last visit, she was off Ozempic for 2 weeks and I advised her to restart it.  She is now back on it. -At that time, weight was approximately stable, but since then, she lost 9 pounds -Will continue walking daily and she is trying to build this up to 2 miles a day.  Also, she instituted healthy changes in her diet.  3. HL - Reviewed latest  lipid panel from 11/2021: LDL above target, otherwise fractions at goal.  Triglycerides were lower. -She initially refused statins repeatedly, but started Lipitor in 09/2021 by Dr. Rennis Golden.  She tolerates the 10 mg daily dose well.  Carlus Pavlov, MD PhD [facility] Endocrinology

## 2023-01-14 IMAGING — MG MM DIGITAL SCREENING BILAT W/ TOMO AND CAD
8 series · 8 of 24 positions shown · non-contrast
Comparison: Previous exam(s).

CLINICAL DATA: Screening.

EXAM:
DIGITAL SCREENING BILATERAL MAMMOGRAM WITH TOMOSYNTHESIS AND CAD
TECHNIQUE: Bilateral screening digital craniocaudal and mediolateral oblique
mammograms were obtained. Bilateral screening digital breast
tomosynthesis was performed. The images were evaluated with
computer-aided detection.

[L MLO synth-2D]
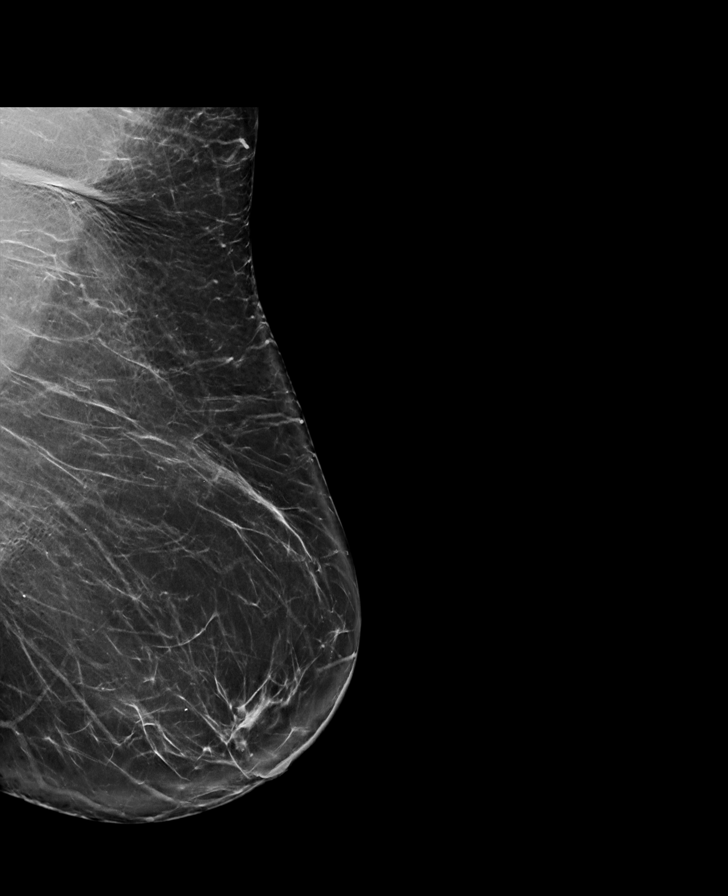

[R MLO synth-2D]
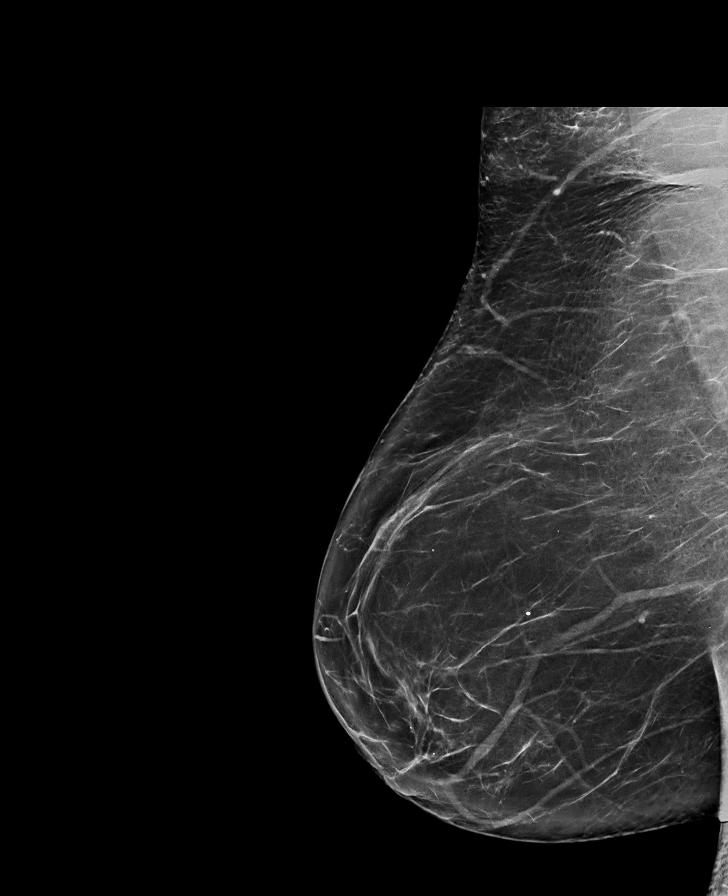

[R CC synth-2D]
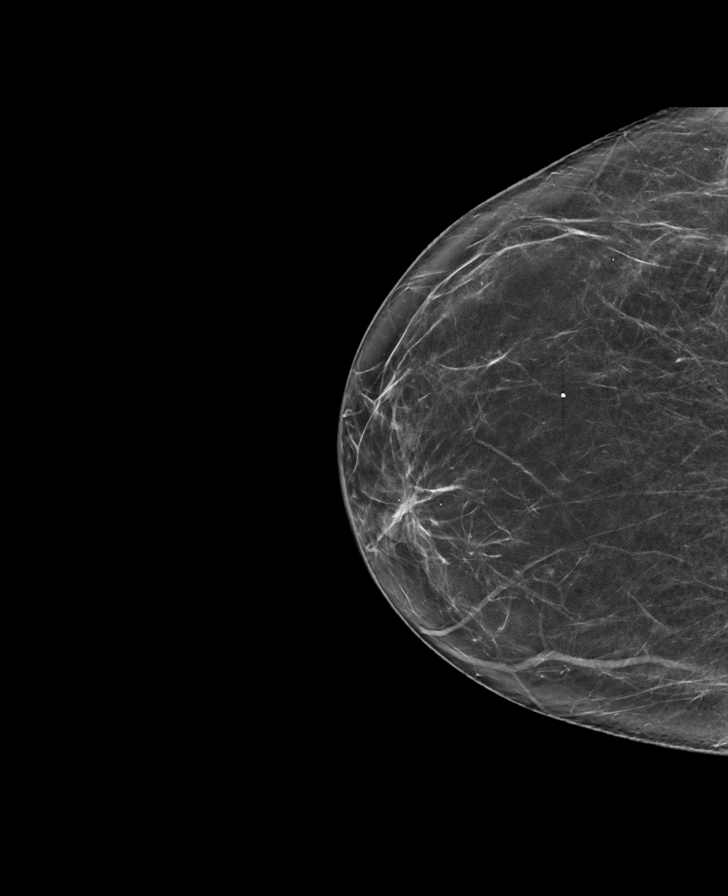

[L CC synth-2D]
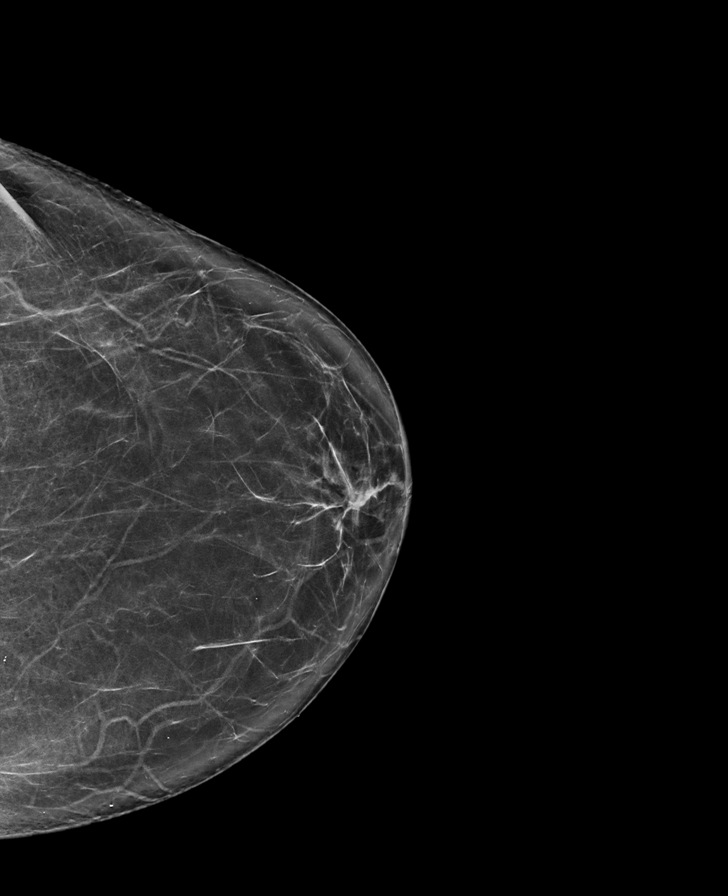

[R MLO tomo · tomo slice 45/89.0]
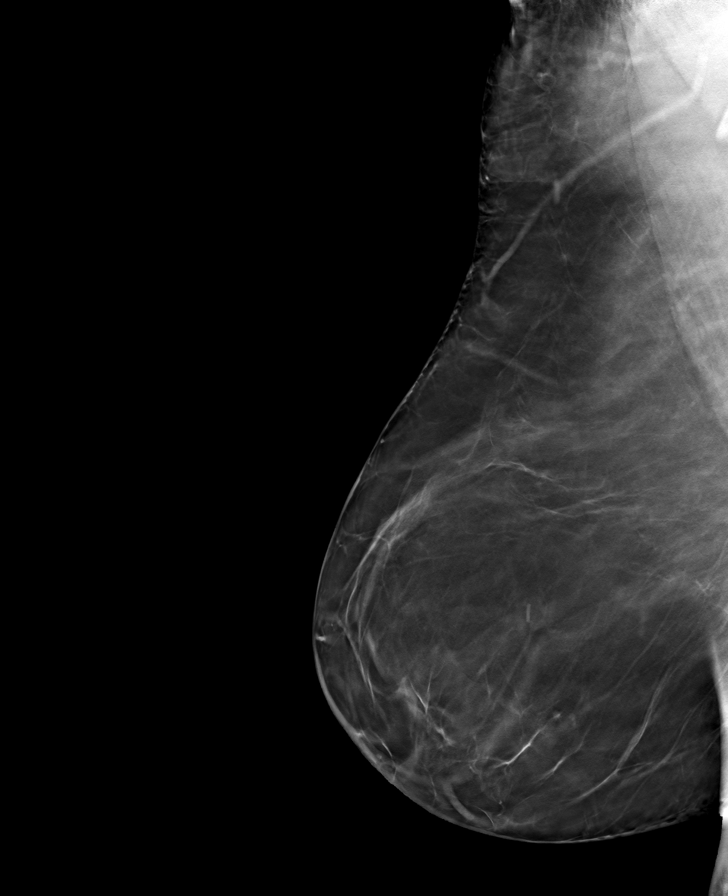

[R CC tomo · tomo slice 35/69.0]
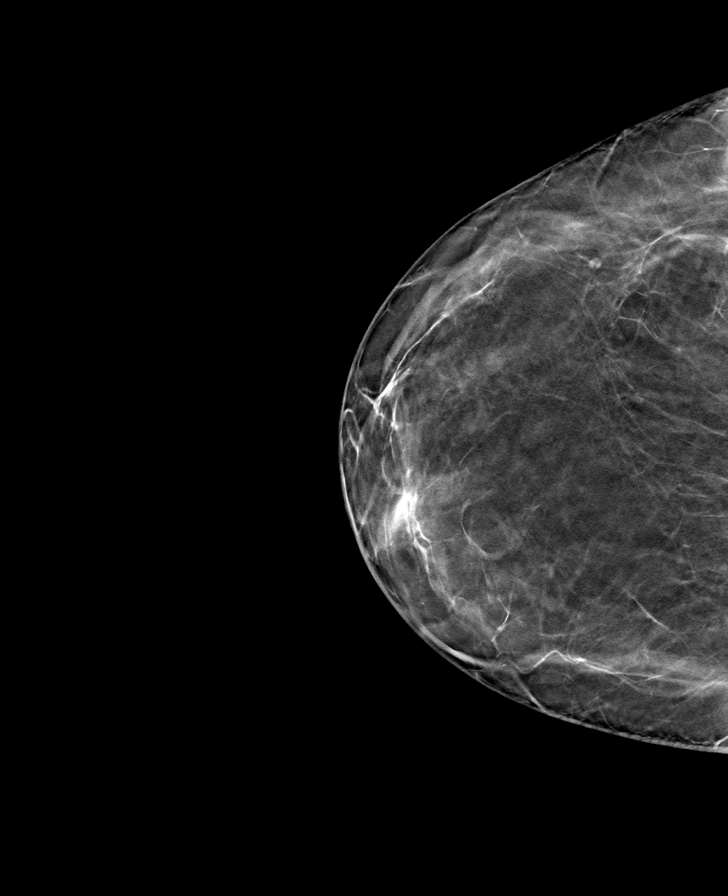

[L CC tomo · tomo slice 37/73.0]
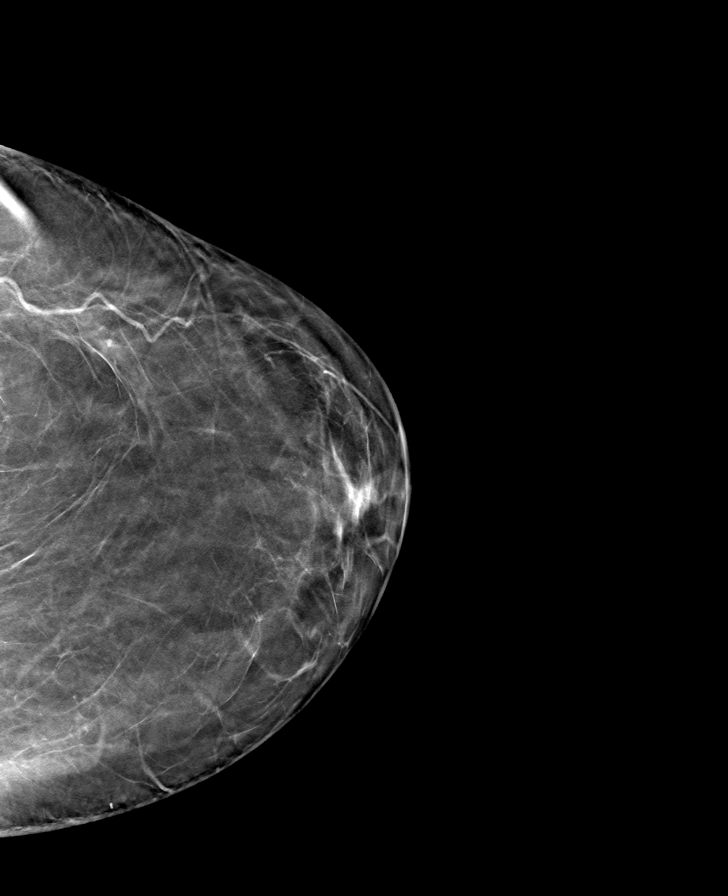

[L MLO tomo · tomo slice 45/88.0]
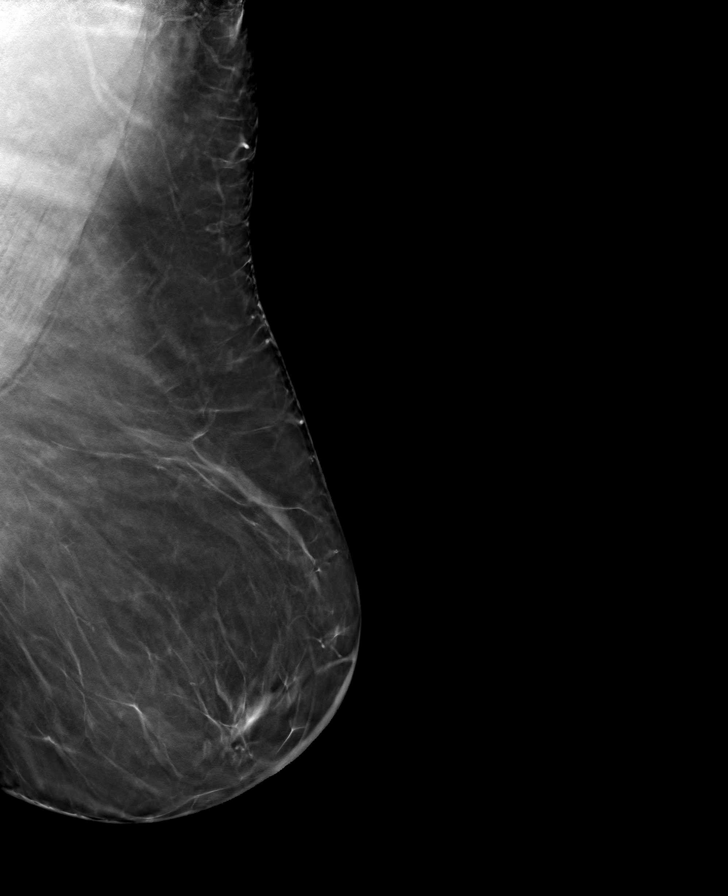

[8 of 24 positions shown; findings below may reference images not displayed]

ACR Breast Density Category b: There are scattered areas of
fibroglandular density.
FINDINGS: There are no findings suspicious for malignancy. The images were
evaluated with computer-aided detection.
IMPRESSION: No mammographic evidence of malignancy. A result letter of this
screening mammogram will be mailed directly to the patient.

RECOMMENDATION:
Screening mammogram in one year. (Code:WJ-[id number])

BI-RADS CATEGORY  1: Negative.

## 2023-01-25 ENCOUNTER — Other Ambulatory Visit: Payer: Self-pay | Admitting: Internal Medicine

## 2023-02-14 DIAGNOSIS — H35371 Puckering of macula, right eye: Secondary | ICD-10-CM | POA: Diagnosis not present

## 2023-03-03 DIAGNOSIS — Z0000 Encounter for general adult medical examination without abnormal findings: Secondary | ICD-10-CM | POA: Diagnosis not present

## 2023-03-03 DIAGNOSIS — E1169 Type 2 diabetes mellitus with other specified complication: Secondary | ICD-10-CM | POA: Diagnosis not present

## 2023-03-03 DIAGNOSIS — E7800 Pure hypercholesterolemia, unspecified: Secondary | ICD-10-CM | POA: Diagnosis not present

## 2023-03-03 DIAGNOSIS — F419 Anxiety disorder, unspecified: Secondary | ICD-10-CM | POA: Diagnosis not present

## 2023-03-03 DIAGNOSIS — I10 Essential (primary) hypertension: Secondary | ICD-10-CM | POA: Diagnosis not present

## 2023-03-15 NOTE — Progress Notes (Unsigned)
Cardiology Office Note    Date:  03/16/2023  ID:  [name], DOB September 13, 1960, MRN [id number] PCP:  Milus Height, PA  Cardiologist:  Chrystie Nose, MD  Electrophysiologist:  None   Chief Complaint: Follow up for cardiomegaly   History of Present Illness: .    Colleen Henry is a 62 y.o. female with visit-pertinent history of hypertensive heart disease, mild LVH, cardiomegaly, heart murmur, dyslipidemia, PFO and type 2 DM.   First evaluated by Dr. Rennis Golden in 07/2021 at the request of her PCP for cardiomegaly and diastolic murmur.  She underwent echocardiogram on 09/14/2021 which showed LVEF of 60 to 65%, LV with normal function, no RWMA, mild concentric LVH, G1 DD.  Left atrial size was moderately dilated VL MR.  A PFO was noted with left-to-right shunting, appeared small to moderate in size.  She had remained asymptomatic regarding her PFO, closure was not indicated at that time.  Dr. Rennis Golden recommended aggressive risk factor modification including better blood pressure control, weight loss and improvements in her dyslipidemia.  Today she presents for follow-up. She reports that she is doing very well.  She denies chest pain, shortness of breath, lower extremity edema, orthopnea or PND.  She walks a mile to a mile and a half daily tolerates well.  She also attends exercise classes on Wednesday night.  She does report she snores and feels fatigued at times during the day. She reports that her blood pressure at her PCPs office earlier this month was 130/80, today her initial blood pressure was 164/98, on recheck 152/88.  She reports that she has had significant work stress this week.  Labwork independently reviewed: 03/06/2023: total cholesterol 171, triglycerides 75, HDL 61 and LDL 95. Creatinine 0.47, sodium 141, potassium 3.8, AST 12, ALT 10  ROS: .   Today she denies chest pain, shortness of breath, lower extremity edema, fatigue, palpitations, melena, hematuria, hemoptysis, diaphoresis,  weakness, presyncope, syncope, orthopnea, and PND.  All other systems are reviewed and otherwise negative. Studies Reviewed: Marland Kitchen    EKG:  EKG is ordered today, personally reviewed, demonstrating  EKG Interpretation Date/Time:  Thursday March 16 2023 15:43:43 EDT Ventricular Rate:  81 PR Interval:  192 QRS Duration:  88 QT Interval:  388 QTC Calculation: 450 R Axis:   -22  Text Interpretation: Normal sinus rhythm Minimal voltage criteria for LVH, may be normal variant ( Cornell product ) Poor R wave progression Nonspecific ST and T wave abnormality No significant change since last tracing Confirmed by Reather Littler [phone]) on 03/16/2023 3:57:27 PM    CV Studies:  Cardiac Studies & Procedures       ECHOCARDIOGRAM  ECHOCARDIOGRAM COMPLETE 09/14/2021  Narrative ECHOCARDIOGRAM REPORT    Patient Name:   Colleen Henry Date of Exam: 09/14/2021 Medical Rec #:  [id number]       Height:       60.0 in Accession #:    [id number]      Weight:       232.0 lb Date of Birth:  March 13, 1961        BSA:          1.988 m Patient Age:    60 years        BP:           156/88 mmHg Patient Gender: F               HR:           69  bpm. Exam Location:  [address]  Procedure: 2D Echo, 3D Echo, Cardiac Doppler and Color Doppler  Indications:    R01.1 Murmur  History:        Patient has no prior history of Echocardiogram examinations. Signs/Symptoms:Murmur and Fatigue; Risk Factors:Hypertension, Diabetes, Dyslipidemia and Family History of Coronary Artery Disease. Possible Cardiomegaly, Obesity.  Sonographer:    Farrel Conners RDCS Referring Phys: Lisette Abu HILTY  IMPRESSIONS   1. Possible PFO. 2. Left ventricular ejection fraction, by estimation, is 60 to 65%. The left ventricle has normal function. The left ventricle has no regional wall motion abnormalities. There is mild concentric left ventricular hypertrophy. Left ventricular diastolic parameters are consistent with Grade I diastolic  dysfunction (impaired relaxation). 3. Right ventricular systolic function is normal. The right ventricular size is normal. 4. Left atrial size was moderately dilated. 5. The mitral valve is normal in structure. Trivial mitral valve regurgitation. No evidence of mitral stenosis. 6. The aortic valve has an indeterminant number of cusps. Aortic valve regurgitation is not visualized. No aortic stenosis is present. 7. The inferior vena cava is normal in size with greater than 50% respiratory variability, suggesting right atrial pressure of 3 mmHg.  Comparison(s): No prior Echocardiogram.  FINDINGS Left Ventricle: Left ventricular ejection fraction, by estimation, is 60 to 65%. The left ventricle has normal function. The left ventricle has no regional wall motion abnormalities. The left ventricular internal cavity size was normal in size. There is mild concentric left ventricular hypertrophy. Left ventricular diastolic parameters are consistent with Grade I diastolic dysfunction (impaired relaxation).  Right Ventricle: The right ventricular size is normal. Right ventricular systolic function is normal.  Left Atrium: Left atrial size was moderately dilated.  Right Atrium: Right atrial size was normal in size.  Pericardium: There is no evidence of pericardial effusion.  Mitral Valve: The mitral valve is normal in structure. Mild mitral annular calcification. Trivial mitral valve regurgitation. No evidence of mitral valve stenosis.  Tricuspid Valve: The tricuspid valve is normal in structure. Tricuspid valve regurgitation is trivial. No evidence of tricuspid stenosis.  Aortic Valve: The aortic valve has an indeterminant number of cusps. Aortic valve regurgitation is not visualized. No aortic stenosis is present. Aortic valve mean gradient measures 9.0 mmHg. Aortic valve peak gradient measures 15.8 mmHg.  Pulmonic Valve: The pulmonic valve was normal in structure. Pulmonic valve regurgitation is  trivial. No evidence of pulmonic stenosis.  Aorta: The aortic root is normal in size and structure.  Venous: The inferior vena cava is normal in size with greater than 50% respiratory variability, suggesting right atrial pressure of 3 mmHg.  IAS/Shunts: No atrial level shunt detected by color flow Doppler.  Additional Comments: Possible PFO.   LEFT VENTRICLE PLAX 2D LVIDd:         4.20 cm   Diastology LVIDs:         2.50 cm   LV e' medial:    7.51 cm/s LV PW:         1.20 cm   LV E/e' medial:  12.7 LV IVS:        0.90 cm   LV e' lateral:   7.93 cm/s LVOT diam:     2.10 cm   LV E/e' lateral: 12.0 LV SV:         84 LV SV Index:   42 LVOT Area:     3.46 cm  3D Volume EF: 3D EF:        73 % LV EDV:  160 ml LV ESV:       43 ml LV SV:        117 ml  RIGHT VENTRICLE RV Basal diam:  3.90 cm RV S prime:     22.70 cm/s TAPSE (M-mode): 3.4 cm  LEFT ATRIUM             Index        RIGHT ATRIUM           Index LA diam:        4.40 cm 2.21 cm/m   RA Area:     18.30 cm LA Vol (A2C):   94.4 ml 47.48 ml/m  RA Volume:   48.70 ml  24.50 ml/m LA Vol (A4C):   92.3 ml 46.43 ml/m LA Biplane Vol: 96.5 ml 48.54 ml/m AORTIC VALVE AV Area (Vmax):    1.86 cm AV Area (Vmean):   1.80 cm AV Area (VTI):     1.81 cm AV Vmax:           198.50 cm/s AV Vmean:          138.000 cm/s AV VTI:            0.464 m AV Peak Grad:      15.8 mmHg AV Mean Grad:      9.0 mmHg LVOT Vmax:         106.50 cm/s LVOT Vmean:        71.550 cm/s LVOT VTI:          0.242 m LVOT/AV VTI ratio: 0.52  AORTA Ao Root diam: 2.85 cm Ao Asc diam:  3.45 cm  MITRAL VALVE MV Area (PHT): cm          SHUNTS MV Decel Time: 255 msec     Systemic VTI:  0.24 m MV E velocity: 95.10 cm/s   Systemic Diam: 2.10 cm MV A velocity: 108.00 cm/s MV E/A ratio:  0.88  Olga Millers MD Electronically signed by Olga Millers MD Signature Date/Time: 09/14/2021/4:54:29 PM    Final              Current Reported  Medications:.    Current Meds  Medication Sig   atorvastatin (LIPITOR) 10 MG tablet Take 15 mg by mouth at bedtime.   Continuous Glucose Sensor (FREESTYLE LIBRE 3 SENSOR) MISC APPLY EVERY 14 DAYS AS DIRECTED   escitalopram (LEXAPRO) 10 MG tablet Take 10 mg by mouth daily.   insulin glargine, 1 Unit Dial, (TOUJEO SOLOSTAR) 300 UNIT/ML Solostar Pen 20 units Subcutaneous once a day at bedtime   lisinopril-hydrochlorothiazide (PRINZIDE,ZESTORETIC) 20-25 MG tablet Take 1 tablet by mouth daily.   metFORMIN (GLUCOPHAGE-XR) 500 MG 24 hr tablet TAKE 2 TABLETS BY MOUTH TWICE DAILY WITH A MEAL   Semaglutide, 1 MG/DOSE, 4 MG/3ML SOPN Inject 1 mg as directed once a week.   Vitamin D, Ergocalciferol, (DRISDOL) 1.25 MG (50000 UNIT) CAPS capsule Take 50,000 Units by mouth once a week.   Physical Exam:    VS:  BP (!) 152/88   Pulse 86   Ht 5' 0.25" (1.53 m)   Wt 215 lb 12.8 oz (97.9 kg)   LMP 11/12/2015   SpO2 97%   BMI 41.80 kg/m    Wt Readings from Last 3 Encounters:  03/16/23 215 lb 12.8 oz (97.9 kg)  11/25/22 224 lb 9.6 oz (101.9 kg)  07/29/22 232 lb 9.6 oz (105.5 kg)    GEN: Well nourished, well developed in no acute distress NECK: No JVD;  No carotid bruits CARDIAC: RRR, 2/6 diastolic murmur rubs, gallops RESPIRATORY:  Clear to auscultation without rales, wheezing or rhonchi  ABDOMEN: Soft, non-tender, non-distended EXTREMITIES:  No edema; No acute deformity   Asessement and Plan:.    Cardiomegaly/hypertension: Echocardiogram 09/14/2021 showed LVEF 60 to 65%, LV with normal function, mild concentric LVH, moderate LA dilation.  Today she denies chest pain, shortness of breath, lower extremity edema, orthopnea or PND.  She appears euvolemic and well compensated on exam.  Blood pressure elevated today initially 164/98, on recheck 152/88.  She reports that at her PCP office earlier this month she was 130/80.  Would recommend stricter blood pressure control to prevent progression to heart failure.  She will monitor her blood pressure once daily for the next week, if consistently elevated above 130/80 she will notify her PCP for further increase of antihypertensive medications. We are happy to assist if needed but primary care follow-up is appropriate. Would recommend considering amlodipine or Coreg.  On lisinopril hydrochlorothiazide 20-25 mg daily.  PFO: Echo 08/2021 indicated small to moderate in size PFO with left-to-right shunting.  Today she denies chest pain or shortness of breath.  She will continue to monitor for symptoms and notify the office. Since this is an incidental finding with no history of embolic phenomena, no further intervention needed at this time.  Hyperlipidemia: Last lipid profile on 03/06/2023 indicated total cholesterol 171, triglycerides 75, HDL 61 and LDL 95.  Patient reports that her PCP recently increased her atorvastatin to 15 mg daily, unusual dose.  Monitor managed per PCP.  DM type 2: Last hemoglobin A1c 8.6 on 07/29/2022.  Monitored and managed per PCP.  Sleep disordered breathing: Patient reports snoring at night and daytime fatigue. STOP-Bang 6.  Will have her complete an Itamar sleep study.  Disposition: F/u with Dr. Rennis Golden in one year or sooner if needed.   Signed, Rip Harbour, NP

## 2023-03-16 ENCOUNTER — Encounter: Payer: Self-pay | Admitting: Cardiology

## 2023-03-16 ENCOUNTER — Telehealth: Payer: Self-pay | Admitting: *Deleted

## 2023-03-16 ENCOUNTER — Ambulatory Visit: Payer: BC Managed Care – PPO | Attending: Physician Assistant | Admitting: Cardiology

## 2023-03-16 VITALS — BP 152/88 | HR 86 | Ht 60.25 in | Wt 215.8 lb

## 2023-03-16 DIAGNOSIS — I10 Essential (primary) hypertension: Secondary | ICD-10-CM | POA: Diagnosis not present

## 2023-03-16 DIAGNOSIS — E785 Hyperlipidemia, unspecified: Secondary | ICD-10-CM | POA: Diagnosis not present

## 2023-03-16 DIAGNOSIS — G4730 Sleep apnea, unspecified: Secondary | ICD-10-CM

## 2023-03-16 DIAGNOSIS — I517 Cardiomegaly: Secondary | ICD-10-CM

## 2023-03-16 DIAGNOSIS — Q2112 Patent foramen ovale: Secondary | ICD-10-CM

## 2023-03-16 NOTE — Telephone Encounter (Signed)
Reather Littler, NP along with Ronie Spies, PAC ordered Itamar study.   Patient agreement reviewed and signed on 03/16/2023.  WatchPAT issued to patient on 03/16/2023 by Danielle Rankin, CMA. Patient aware to not open the WatchPAT box until contacted with the activation PIN. Patient profile initialized in CloudPAT on 03/16/2023 by Danielle Rankin, CMA. Device serial number: [id number]  Please list Reason for Call as Advice Only and type "WatchPAT issued to patient" in the comment box.

## 2023-03-16 NOTE — Patient Instructions (Signed)
Medication Instructions:  Your physician recommends that you continue on your current medications as directed. Please refer to the Current Medication list given to you today.  *If you need a refill on your cardiac medications before your next appointment, please call your pharmacy*   Lab Work: None ordered  If you have labs (blood work) drawn today and your tests are completely normal, you will receive your results only by: MyChart Message (if you have MyChart) OR A paper copy in the mail If you have any lab test that is abnormal or we need to change your treatment, we will call you to review the results.   Testing/Procedures: Your physician has recommended that you have a sleep study. This test records several body functions during sleep, including: brain activity, eye movement, oxygen and carbon dioxide blood levels, heart rate and rhythm, breathing rate and rhythm, the flow of air through your mouth and nose, snoring, body muscle movements, and chest and belly movement.    Follow-Up: At [facility], you and your health needs are our priority.  As part of our continuing mission to provide you with exceptional heart care, we have created designated Provider Care Teams.  These Care Teams include your primary Cardiologist (physician) and Advanced Practice Providers (APPs -  Physician Assistants and Nurse Practitioners) who all work together to provide you with the care you need, when you need it.  We recommend signing up for the patient portal called "MyChart".  Sign up information is provided on this After Visit Summary.  MyChart is used to connect with patients for Virtual Visits (Telemedicine).  Patients are able to view lab/test results, encounter notes, upcoming appointments, etc.  Non-urgent messages can be sent to your provider as well.   To learn more about what you can do with MyChart, go to [email or web address].    Your next appointment:   12 month(s)  Provider:    Chrystie Nose, MD     Other Instructions  Your physician has requested that you regularly monitor and record your blood pressure readings at home. Please use the same machine at the same time of day to check your readings and record them to bring to your follow-up visit.   Please monitor blood pressures and keep a log of your readings for 1 week then sen them readings to your pcp.   Make sure to check 2 hours after your medications.    AVOID these things for 30 minutes before checking your blood pressure: No Drinking caffeine. No Drinking alcohol. No Eating. No Smoking. No Exercising.   Five minutes before checking your blood pressure: Pee. Sit in a dining chair. Avoid sitting in a soft couch or armchair. Be quiet. Do not talk

## 2023-03-31 ENCOUNTER — Encounter: Payer: Self-pay | Admitting: Internal Medicine

## 2023-03-31 ENCOUNTER — Ambulatory Visit: Payer: BC Managed Care – PPO | Admitting: Internal Medicine

## 2023-03-31 VITALS — BP 130/64 | HR 66 | Ht 60.25 in | Wt 212.4 lb

## 2023-03-31 DIAGNOSIS — E785 Hyperlipidemia, unspecified: Secondary | ICD-10-CM | POA: Diagnosis not present

## 2023-03-31 DIAGNOSIS — E1165 Type 2 diabetes mellitus with hyperglycemia: Secondary | ICD-10-CM

## 2023-03-31 DIAGNOSIS — Z794 Long term (current) use of insulin: Secondary | ICD-10-CM | POA: Diagnosis not present

## 2023-03-31 NOTE — Progress Notes (Addendum)
Patient ID: Colleen Henry, female   DOB: Feb 18, 1961, 62 y.o.   MRN: [id number]  HPI: Colleen Henry is a 62 y.o.-year-old female, returning for f/u for DM2, dx in ~2011, insulin-dependent since 10/2019, uncontrolled, without long-term complications. Last visit 4 months ago.  Interim history: No increased urination, blurry vision, nausea, chest pain.  She continues walking every day in am (30 min), also started dietary changes before last visit.  She lost 9 pounds before last visit and 12 more since then. She is preparing for her son's wedding at the end of this month.  Reviewed HbA1c levels: Lab Results  Component Value Date   HGBA1C 7.5 11/25/2022   HGBA1C 8.6 (A) 07/29/2022   HGBA1C 9.2 (A) 12/14/2021  11/2014: HbA1c 9.9% 09/17/2014: HbA1c 12.2% 04/11/2014: HbA1c 8.6%  Pt was on a regimen of: - Metformin XR 1000 mg bid - cannot remember what happened with reg metformin - Farxiga 10 mg daily in am - started 07/2015 >> switched to Invokana but ran out  - Biocell: Biotin, Zinc, Chromium She stopped Amaryl - was taking 2 mg before b'fast Insulin was suggested to her >> too expensive 300$/mo  Then on: - Metformin ER 1000 mg 2x a day >> 1000 mg in am (not taking the evening dose in last 2 mo) - Toujeo 14 units daily  - added 10/2019 >> off 08/2020 >> restarted at 20 >>  (she forgot) - Ozempic 0.25 mg weekly-added 08/2020 >> 0.5 mg weekly Refused Trulicity in the past, including 03/31/2020 Previously on glipizide 5 mg twice a day. Evaristo Bury was not covered for her.  We changed the regimen: - Metformin ER 1000 mg 2x a day - Ozempic 0.5 >> 1 mg weekly in a.m. - Toujeo 24 >> 20 >> 28 units daily  She is again checking blood sugars with the freestyle libre CGM, reattached after last visit:  Previously:   Lowest sugar was 46 >> 200 >> ... 135 >> ?; it is unclear at which CBG level she has hypoglycemia awareness. Highest sugar was 380s >> 183 >> 171 >> 170 >> ?Marland Kitchen  Glucometer:  UltraTrak >> Easy Touch  -No CKD but she does have microalbuminuria.  Results Component Value Reference Range  Comp Metabolic Panel Reviewed date:03/06/2023 01:18:52 PM Interpretation: Performing Lab: Notes/Report: Testing Performed at: [facility], [address], [address], [place], [place] [address]  Glucose 122 70-99 mg/dL  BUN 15 5-78 mg/dL  Creatinine 4.69 6.29-5.28 mg/dl  [id number] 027 >25 calc  Sodium 141 136-145 mmol/L  Potassium 3.8 3.5-5.5 mmol/L  Chloride 100 98-107 mmol/L  CO2 35 22-32 mmol/L  Anion Gap 9.2 6.0-20.0 mmol/L  Calcium 9.6 8.6-10.3 mg/dL  CA-corrected 3.66 4.40-34.74 mg/dL  Protein, Total 6.9 2.5-9.5 g/dL  Albumin 4.2 6.3-8.7 g/dL  TBIL 1.0 5.6-4.3 mg/dL  ALP 83 32-951 U/L  AST 12 0-39 U/L  ALT 10 0-52 U/L   Lab Results  Component Value Date   BUN 10 07/14/2021   CREATININE 0.45 07/14/2021   Microalbumin Panel Reviewed date:03/06/2023 04:09:48 PM Interpretation:Positive (53.6) Performing Lab: Notes/Report: Testing Performed at: [facility], [address], [address], [place], [place] [address]  MA/CR ratio 53.6 0.0-30.0 mg/G    UMA 4.84 0.00-1.90 mg/dL    UCR 90      Lab Results  Component Value Date   MICRALBCREAT 4.5 02/28/2019  02/24/2021: ACR 109.5 03/31/2020: ACR 128.8 12/26/2019: Glucose 135, BUN/creatinine 13/0.46, GFR 140 10/06/2017: Glucose 126, BUN/creatinine 11/0.43, GFR 153, ACR 71.6 02/17/2016: ACR 66.8  07/31/2015: 14/0.41, eGFR 162 01/29/2015: ACR 31.6. On lisinopril.  -+ HL; last set of lipids:  12/10/2021:185/96/62/105 02/24/2021: 216/75/64/139 12/26/2019: 216/88/62/139 Lab Results  Component Value Date   CHOL 203 (H) 02/28/2019   HDL 52.00 02/28/2019   LDLCALC 130 (H) 02/28/2019   TRIG 105.0 02/28/2019   CHOLHDL 4 02/28/2019  10/06/2017: 203/112/49/132 04/13/2017: 213/104/54/139 07/31/2015: 190/75/54/120 09/17/2014: 183/109/47/114 04/11/2014: 189/87/48/124  She initially refused statins. She is on Lipitor 10 >>  15 mg daily - started 09/2021 by Dr. Rennis Golden.  - last eye exam was on 02/14/2023: stable + DR, was getting IO injections -stopped during the pandemic.  - + numbness and tingling in her feet.  Last foot exam 07/29/2022.  She mentions that she recently had a health screen including a carotid Doppler.  She was told that she had an abnormality in her thyroid.  She does not have this result with her but will send it to me.  A TSH was normal: 07/30//2024 - [facility]: TSH 0.65  ROS:  + See HPI  Past Medical History:  Diagnosis Date   Complication of anesthesia    agitation upon waking up as a child .    Depression    Diabetes mellitus    oral meds for diabetes . Type 2   Hypertension    Snores    Past Surgical History:  Procedure Laterality Date   BREAST CYST ASPIRATION     CESAREAN SECTION  01/16/1998   HERNIA REPAIR  2013   I & D EXTREMITY     INSERTION OF MESH  04/20/2012   Procedure: INSERTION OF MESH;  Surgeon: Ardeth Sportsman, MD;  Location: [facility];  Service: General;  Laterality: N/A;   MASS EXCISION Right 04/19/2016   Procedure: RIGHT SMALL FINGER EXCISION MASS;  Surgeon: Cindee Salt, MD;  Location: [facility];  Service: Orthopedics;  Laterality: Right;   TONSILLECTOMY AND ADENOIDECTOMY  age 62   VENTRAL HERNIA REPAIR  04/20/2012   Procedure: LAPAROSCOPIC VENTRAL HERNIA;  Surgeon: Ardeth Sportsman, MD;  Location: [facility];  Service: General;  Laterality: N/A;  laparoscopic ventral wall hernia repair   VENTRAL HERNIA REPAIR  04/20/2012   Procedure: HERNIA REPAIR VENTRAL ADULT;  Surgeon: Ardeth Sportsman, MD;  Location: [facility];  Service: General;  Laterality: N/A;  Repair incarcerated ventral hernia times two.   History   Social History   Marital Status: Married    Spouse Name: N/A   Number of Children: 1   Occupational History    Physiological scientist   Social History Main Topics   Smoking status: Never Smoker    Smokeless tobacco: Never Used   Alcohol Use: Yes      Comment: occasionally - socially -   Wine or vodka once or twice a month , 2-3 drinks   Drug Use: No   Current Outpatient Medications on File Prior to Visit  Medication Sig Dispense Refill   atorvastatin (LIPITOR) 10 MG tablet Take 15 mg by mouth at bedtime.     Continuous Glucose Sensor (FREESTYLE LIBRE 3 SENSOR) MISC APPLY EVERY 14 DAYS AS DIRECTED 6 each 3   escitalopram (LEXAPRO) 10 MG tablet Take 10 mg by mouth daily.     insulin glargine, 1 Unit Dial, (TOUJEO SOLOSTAR) 300 UNIT/ML Solostar Pen 20 units Subcutaneous once a day at bedtime     lisinopril-hydrochlorothiazide (PRINZIDE,ZESTORETIC) 20-25 MG tablet Take 1 tablet by mouth daily.     metFORMIN (GLUCOPHAGE-XR) 500 MG 24  hr tablet TAKE 2 TABLETS BY MOUTH TWICE DAILY WITH A MEAL 360 tablet 1   Semaglutide, 1 MG/DOSE, 4 MG/3ML SOPN Inject 1 mg as directed once a week. 9 mL 3   Vitamin D, Ergocalciferol, (DRISDOL) 1.25 MG (50000 UNIT) CAPS capsule Take 50,000 Units by mouth once a week.     No current facility-administered medications on file prior to visit.   Allergies  Allergen Reactions   Penicillins Hives    Tolerates keflex   Family History  Problem Relation Age of Onset   Hypertension Mother    Hyperlipidemia Mother    Diabetes Maternal Grandmother    Heart disease Maternal Grandfather    Hyperlipidemia Maternal Grandfather    PE: BP 130/64   Pulse 66   Ht 5' 0.25" (1.53 m)   Wt 212 lb 6.4 oz (96.3 kg)   LMP 11/12/2015   SpO2 96%   BMI 41.14 kg/m   Wt Readings from Last 3 Encounters:  03/31/23 212 lb 6.4 oz (96.3 kg)  03/16/23 215 lb 12.8 oz (97.9 kg)  11/25/22 224 lb 9.6 oz (101.9 kg)   Constitutional: overweight, in NAD Eyes: EOMI, no exophthalmos ENT: no thyromegaly, no cervical lymphadenopathy Cardiovascular: RRR, No MRG Respiratory: CTA B Musculoskeletal: no deformities Skin: no rashes Neurological: no tremor with outstretched hands  ASSESSMENT: 1. DM2,  insulin-dependent, uncontrolled,  with complications -Cardiomegaly  No family history of medullary thyroid cancer or personal history of pancreatitis.  2. Obesity class 3  3. HL  PLAN:  1. Patient with longstanding, uncontrolled type 2 diabetes, on metformin, long-acting insulin and weekly GLP-1 receptor agonist with improving control.  At last visit, HbA1c was 7.5%, previously 8.6%, but even higher in the past.  At last visit, she was off the sensor and was not checking blood sugars after the sensor came off but we attached this at last visit.  We did not change her regimen.  I advised him to continue with walking and improving her diet. CGM interpretation: -At today's visit, we reviewed her CGM downloads: It appears that 91% of values are in target range (goal >70%), while 9% are higher than 180 (goal <25%), and 0% are lower than 70 (goal <4%).  The calculated average blood sugar is 135.  The projected HbA1c for the next 3 months (GMI) is 6.5%. -Reviewing the CGM trends, sugars are mostly fluctuating within the target range, improved, with only occasional higher blood sugars after dinner particularly, and less so after lunch.  I am surprised about the HbA1c today, which is stable compared to before, what I would expected it to decrease at least 0.5%.  She did have some higher blood sugars after certain meals and we discussed about possibly adding back to glipizide as needed before a larger meal, especially with the holidays coming up.  Otherwise, I recommended to stay on the same regimen. - I suggested to:  Patient Instructions  Please continue: - Metformin ER 1000 mg 2x a day - Toujeo 20 units daily - Ozempic 1 mg weekly in a.m.   Add: - Glipizide 5 mg before a larger meal  Please return in 4 months.  - we checked her HbA1c: 7.5% (stable) - advised to check sugars at different times of the day - 4x a day, rotating check times - advised for yearly eye exams >> she is UTD - return to clinic in 4 months   2. Obesity  class 3 -She continues on Ozempic which should also help with weight  loss. -She lost 9 pounds before last visit -At last visit she was walking daily and trying to build up to 2 miles a day.  She also instituted healthy changes in her diet.  I advised her to continue.  -She lost 12 more pounds since then  3. HL - Reviewed latest lipid panel from 02/2023: LDL above target, otherwise fractions at goal:  - Her Lipitor dose was increased from 10 to 15 mg daily - no side effects.  Carlus Pavlov, MD PhD [facility]ocr[facility]

## 2023-03-31 NOTE — Patient Instructions (Addendum)
Please continue: - Metformin ER 1000 mg 2x a day - Toujeo 20 units daily - Ozempic 1 mg weekly in a.m.   Add: - Glipizide 5 mg before a larger meal  Please return in 4 months.

## 2023-04-07 ENCOUNTER — Telehealth: Payer: Self-pay

## 2023-04-07 NOTE — Telephone Encounter (Signed)
**Note De-Identified Thurmond Hildebran Obfuscation** Ordering provider: Reather Littler, NP Associated diagnoses: Snoring-R06.83 and Fatigue-R53.83 WatchPAT PA obtained on 04/07/2023 by Chukwuemeka Artola, Lorelle Formosa, LPN. Health Plan: [employer]BS[place]: Authorized: Order ID: [id number]  Approval Valid Through:04/07/2023 - 06/05/2023 Patient notified of PIN (1234) on 04/07/2023 Daleyza Gadomski Notification Method: MyChart message and I left a detailed message (Ok per [facility]) on the pts VM advising her that her Pin # is "[id number]" and that she can proceed with her HST now.  I did leave our office phone number in the message so she can call us back if she has any questions or concerns.  Phone note routed to covering staff for follow-up.

## 2023-04-19 ENCOUNTER — Other Ambulatory Visit: Payer: Self-pay | Admitting: Internal Medicine

## 2023-04-19 DIAGNOSIS — E1165 Type 2 diabetes mellitus with hyperglycemia: Secondary | ICD-10-CM

## 2023-04-20 ENCOUNTER — Telehealth: Payer: Self-pay | Admitting: Internal Medicine

## 2023-04-20 ENCOUNTER — Other Ambulatory Visit: Payer: Self-pay

## 2023-04-20 DIAGNOSIS — E1165 Type 2 diabetes mellitus with hyperglycemia: Secondary | ICD-10-CM

## 2023-04-20 MED ORDER — TOUJEO SOLOSTAR 300 UNIT/ML ~~LOC~~ SOPN: 20.0000 [IU] | PEN_INJECTOR | Freq: Every day | SUBCUTANEOUS | 0 refills | Status: DC

## 2023-04-20 NOTE — Telephone Encounter (Signed)
MEDICATION: Toujeo SoloStar insulin glargine, 1 Unit Dial, (TOUJEO SOLOSTAR) 300 UNIT/ML Solostar Pen  PHARMACY:    [facility] - [place], [place] - [address] (Ph: [phone])    HAS THE PATIENT CONTACTED THEIR PHARMACY?  Yes  IS THIS A 90 DAY SUPPLY : Yes  IS PATIENT OUT OF MEDICATION: Yes  IF NOT; HOW MUCH IS LEFT:   LAST APPOINTMENT DATE: @11 /05/2022  NEXT APPOINTMENT DATE:@3 /08/2023  DO WE HAVE YOUR PERMISSION TO LEAVE A DETAILED MESSAGE?:Yes  OTHER COMMENTS:    **Let patient know to contact pharmacy at the end of the day to make sure medication is ready. **  ** Please notify patient to allow 48-72 hours to process**  **Encourage patient to contact the pharmacy for refills or they can request refills through [facility]**

## 2023-05-05 DIAGNOSIS — E559 Vitamin D deficiency, unspecified: Secondary | ICD-10-CM | POA: Diagnosis not present

## 2023-06-01 DIAGNOSIS — K760 Fatty (change of) liver, not elsewhere classified: Secondary | ICD-10-CM | POA: Diagnosis not present

## 2023-06-01 DIAGNOSIS — R1011 Right upper quadrant pain: Secondary | ICD-10-CM | POA: Diagnosis not present

## 2023-06-01 DIAGNOSIS — Z860100 Personal history of colon polyps, unspecified: Secondary | ICD-10-CM | POA: Diagnosis not present

## 2023-06-01 DIAGNOSIS — R933 Abnormal findings on diagnostic imaging of other parts of digestive tract: Secondary | ICD-10-CM | POA: Diagnosis not present

## 2023-06-02 ENCOUNTER — Other Ambulatory Visit: Payer: Self-pay | Admitting: Gastroenterology

## 2023-06-02 DIAGNOSIS — R933 Abnormal findings on diagnostic imaging of other parts of digestive tract: Secondary | ICD-10-CM

## 2023-06-07 DIAGNOSIS — E7800 Pure hypercholesterolemia, unspecified: Secondary | ICD-10-CM | POA: Diagnosis not present

## 2023-06-22 ENCOUNTER — Ambulatory Visit
Admission: RE | Admit: 2023-06-22 | Discharge: 2023-06-22 | Disposition: A | Discharge status: Home / Self Care | Payer: BC Managed Care – PPO | Source: Ambulatory Visit | Attending: Gastroenterology | Admitting: Gastroenterology

## 2023-06-22 DIAGNOSIS — R1010 Upper abdominal pain, unspecified: Secondary | ICD-10-CM | POA: Diagnosis not present

## 2023-06-22 DIAGNOSIS — K862 Cyst of pancreas: Secondary | ICD-10-CM | POA: Diagnosis not present

## 2023-06-22 DIAGNOSIS — R933 Abnormal findings on diagnostic imaging of other parts of digestive tract: Secondary | ICD-10-CM

## 2023-06-22 MED ORDER — GADOPICLENOL 0.5 MMOL/ML IV SOLN
10.0000 mL | Freq: Once | INTRAVENOUS | Status: AC | PRN
  Administered 2023-06-22: 10 mL via INTRAVENOUS

## 2023-07-03 ENCOUNTER — Other Ambulatory Visit: Payer: Self-pay | Admitting: Internal Medicine

## 2023-08-01 ENCOUNTER — Ambulatory Visit: Payer: BC Managed Care – PPO | Admitting: Internal Medicine

## 2023-08-03 DIAGNOSIS — L570 Actinic keratosis: Secondary | ICD-10-CM | POA: Diagnosis not present

## 2023-08-03 DIAGNOSIS — M67449 Ganglion, unspecified hand: Secondary | ICD-10-CM | POA: Diagnosis not present

## 2023-08-03 DIAGNOSIS — L282 Other prurigo: Secondary | ICD-10-CM | POA: Diagnosis not present

## 2023-08-03 DIAGNOSIS — L578 Other skin changes due to chronic exposure to nonionizing radiation: Secondary | ICD-10-CM | POA: Diagnosis not present

## 2023-08-03 DIAGNOSIS — D225 Melanocytic nevi of trunk: Secondary | ICD-10-CM | POA: Diagnosis not present

## 2023-08-15 NOTE — Telephone Encounter (Signed)
 Opened in error

## 2023-08-22 DIAGNOSIS — Z01419 Encounter for gynecological examination (general) (routine) without abnormal findings: Secondary | ICD-10-CM | POA: Diagnosis not present

## 2023-08-22 DIAGNOSIS — Z6841 Body Mass Index (BMI) 40.0 and over, adult: Secondary | ICD-10-CM | POA: Diagnosis not present

## 2023-09-08 ENCOUNTER — Other Ambulatory Visit: Payer: Self-pay | Admitting: Internal Medicine

## 2023-09-08 DIAGNOSIS — Z794 Long term (current) use of insulin: Secondary | ICD-10-CM

## 2023-09-08 DIAGNOSIS — H40053 Ocular hypertension, bilateral: Secondary | ICD-10-CM | POA: Diagnosis not present

## 2023-09-08 DIAGNOSIS — E113292 Type 2 diabetes mellitus with mild nonproliferative diabetic retinopathy without macular edema, left eye: Secondary | ICD-10-CM | POA: Diagnosis not present

## 2023-09-08 LAB — HM DIABETES EYE EXAM

## 2023-09-14 ENCOUNTER — Other Ambulatory Visit: Payer: Self-pay | Admitting: Obstetrics and Gynecology

## 2023-09-14 DIAGNOSIS — Z1231 Encounter for screening mammogram for malignant neoplasm of breast: Secondary | ICD-10-CM

## 2023-09-26 ENCOUNTER — Ambulatory Visit: Payer: BC Managed Care – PPO | Admitting: Internal Medicine

## 2023-09-26 ENCOUNTER — Encounter: Payer: Self-pay | Admitting: Internal Medicine

## 2023-09-26 VITALS — BP 136/80 | HR 77 | Ht 60.25 in | Wt 213.4 lb

## 2023-09-26 DIAGNOSIS — E1165 Type 2 diabetes mellitus with hyperglycemia: Secondary | ICD-10-CM | POA: Diagnosis not present

## 2023-09-26 DIAGNOSIS — E785 Hyperlipidemia, unspecified: Secondary | ICD-10-CM

## 2023-09-26 DIAGNOSIS — Z794 Long term (current) use of insulin: Secondary | ICD-10-CM | POA: Diagnosis not present

## 2023-09-26 LAB — POCT GLYCOSYLATED HEMOGLOBIN (HGB A1C): Hemoglobin A1C: 7.2 % — AB (ref 4.0–5.6)

## 2023-09-26 MED ORDER — FREESTYLE LIBRE 3 PLUS SENSOR MISC: 1.0000 | 3 refills | Status: AC

## 2023-09-26 MED ORDER — GLIPIZIDE 5 MG PO TABS: 5.0000 mg | ORAL_TABLET | Freq: Every day | ORAL | 3 refills | Status: DC

## 2023-09-26 NOTE — Patient Instructions (Addendum)
 Please continue: - Metformin  ER 1000 mg in am and 500 mg at night - Toujeo  20 units daily - Ozempic  0.5 mg weekly in a.m.   Please start: - Glipizide  5 mg before a larger meal  Please return in 4 months.

## 2023-09-26 NOTE — Progress Notes (Signed)
 Patient ID: Colleen Henry, female   DOB: 1960/07/16, 63 y.o.   MRN: [id number]  HPI: Colleen Henry is a 63 y.o.-year-old female, returning for f/u for DM2, dx in ~2011, insulin -dependent since 10/2019, uncontrolled, without long-term complications. Last visit 4 months ago.  Interim history: No increased urination, blurry vision, nausea, chest pain.  Before last visit she was walking every day and started to change her diet.  She continues to do so and now added weight exercises.  She lost 21 pounds before the last visit combined. Weight ~stable now.  Reviewed HbA1c levels: 03/31/2023: HbA1c 7.5% Lab Results  Component Value Date   HGBA1C 7.5 11/25/2022   HGBA1C 8.6 (A) 07/29/2022   HGBA1C 9.2 (A) 12/14/2021  11/2014: HbA1c 9.9% 09/17/2014: HbA1c 12.2% 04/11/2014: HbA1c 8.6%  Pt was on a regimen of: - Metformin  XR 1000 mg bid - cannot remember what happened with reg metformin  - Farxiga  10 mg daily in am - started 07/2015 >> switched to Invokana  but ran out  - Biocell: Biotin, Zinc, Chromium She stopped Amaryl  - was taking 2 mg before b'fast Insulin  was suggested to her >> too expensive 300$/mo  Then on: - Metformin  ER 1000 mg 2x a day >> 1000 mg in am (not taking the evening dose in last 2 mo) - Toujeo  14 units daily  - added 10/2019 >> off 08/2020 >> restarted at 20 >>  (she forgot) - Ozempic  0.25 mg weekly-added 08/2020 >> 0.5 mg weekly Refused Trulicity  in the past, including 03/31/2020 Previously on glipizide  5 mg twice a day. Tresiba  was not covered for her.  We changed the regimen: - Metformin  ER 1000 mg 2x a day >> 1000 mg in am and 500 mg at night - - not using - Ozempic  0.5 >> 1  >> 0.5 mg weekly in a.m. (had to decrease the dose due to AP/vomiting/diarrhea) - Toujeo  24 >> 20 >> 28 >> 20 units daily  She is checking blood sugars with the freestyle libre CGM:  Previously:  Previously:   Lowest sugar was 46 >> 200 >> ... 135 >> 70; it is unclear at which CBG  level she has hypoglycemia awareness. Highest sugar was 380s >> .Aaron Aas.170 >> upper 200s.  Glucometer: UltraTrak >> Easy Touch  -No CKD but she does have microalbuminuria.  Results Component Value Reference Range  Comp Metabolic Panel Reviewed date:03/06/2023 01:18:52 PM Interpretation: Performing Lab: Notes/Report: Testing Performed at: [facility], [address], [address], [place], [place] [address]  Glucose 122 70-99 mg/dL  BUN 15 1-91 mg/dL  Creatinine 4.78 2.95-6.21 mg/dl  [id number] 696 >29 calc  Sodium 141 136-145 mmol/L  Potassium 3.8 3.5-5.5 mmol/L  Chloride 100 98-107 mmol/L  CO2 35 22-32 mmol/L  Anion Gap 9.2 6.0-20.0 mmol/L  Calcium 9.6 8.6-10.3 mg/dL  CA-corrected 5.28 4.13-24.40 mg/dL  Protein, Total 6.9 1.0-2.7 g/dL  Albumin 4.2 2.5-3.6 g/dL  TBIL 1.0 6.4-4.0 mg/dL  ALP 83 34-742 U/L  AST 12 0-39 U/L  ALT 10 0-52 U/L   Lab Results  Component Value Date   BUN 10 07/14/2021   CREATININE 0.45 07/14/2021   Microalbumin Panel Reviewed date:03/06/2023 04:09:48 PM Interpretation:Positive (53.6) Performing Lab: Notes/Report: Testing Performed at: [facility], [address], [place], [place] [address]  MA/CR ratio 53.6 0.0-30.0 mg/G    UMA 4.84 0.00-1.90 mg/dL    UCR 90      Lab Results  Component Value Date   MICRALBCREAT 4.5 02/28/2019  02/24/2021: ACR 109.5 03/31/2020: ACR  128.8 12/26/2019: Glucose 135, BUN/creatinine 13/0.46, GFR 140 10/06/2017: Glucose 126, BUN/creatinine 11/0.43, GFR 153, ACR 71.6 02/17/2016: ACR 66.8 07/31/2015: 14/0.41, eGFR 162 01/29/2015: ACR 31.6. On lisinopril .  -+ HL; last set of lipids:   Lab Results  Component Value Date   CHOL 203 (H) 02/28/2019   HDL 52.00 02/28/2019   LDLCALC 130 (H) 02/28/2019   TRIG 105.0 02/28/2019   CHOLHDL 4 02/28/2019   She initially refused statins. She is on Lipitor 10 >> 15 mg daily - started 09/2021 by Dr. Maximo Spar.  - last eye exam was on 02/14/2023: stable + DR, was getting IO  injections -stopped during the pandemic.  - + numbness and tingling in her feet.  Last foot exam 07/29/2022.  She mentions that she recently had a health screen including a carotid Doppler.  She was told that she had an abnormality in her thyroid .  She does not have this result with her but will send it to me.  A TSH was normal: 07/30//2024 - [facility]: TSH 0.65  ROS:  + See HPI  Past Medical History:  Diagnosis Date   Complication of anesthesia    agitation upon waking up as a child .    Depression    Diabetes mellitus    oral meds for diabetes . Type 2   Hypertension    Snores    Past Surgical History:  Procedure Laterality Date   BREAST CYST ASPIRATION     CESAREAN SECTION  01/16/1998   HERNIA REPAIR  2013   I & D EXTREMITY     INSERTION OF MESH  04/20/2012   Procedure: INSERTION OF MESH;  Surgeon: Eddye Goodie, MD;  Location: [facility];  Service: General;  Laterality: N/A;   MASS EXCISION Right 04/19/2016   Procedure: RIGHT SMALL FINGER EXCISION MASS;  Surgeon: Lyanne Sample, MD;  Location: [facility];  Service: Orthopedics;  Laterality: Right;   TONSILLECTOMY AND ADENOIDECTOMY  age 58   VENTRAL HERNIA REPAIR  04/20/2012   Procedure: LAPAROSCOPIC VENTRAL HERNIA;  Surgeon: Eddye Goodie, MD;  Location: [facility];  Service: General;  Laterality: N/A;  laparoscopic ventral wall hernia repair   VENTRAL HERNIA REPAIR  04/20/2012   Procedure: HERNIA REPAIR VENTRAL ADULT;  Surgeon: Eddye Goodie, MD;  Location: [facility];  Service: General;  Laterality: N/A;  Repair incarcerated ventral hernia times two.   History   Social History   Marital Status: Married    Spouse Name: N/A   Number of Children: 1   Occupational History    Physiological scientist   Social History Main Topics   Smoking status: Never Smoker    Smokeless tobacco: Never Used   Alcohol Use: Yes     Comment: occasionally - socially -   Wine or vodka once or twice a month , 2-3 drinks   Drug Use: No    Current Outpatient Medications on File Prior to Visit  Medication Sig Dispense Refill   atorvastatin (LIPITOR) 10 MG tablet Take 15 mg by mouth at bedtime.     Continuous Glucose Sensor (FREESTYLE LIBRE 3 SENSOR) MISC APPLY EVERY 14 DAYS AS DIRECTED 6 each 3   escitalopram (LEXAPRO) 10 MG tablet Take 10 mg by mouth daily.     lisinopril -hydrochlorothiazide  (PRINZIDE ,ZESTORETIC ) 20-25 MG tablet Take 1 tablet by mouth daily.     metFORMIN  (GLUCOPHAGE -XR) 500 MG 24 hr tablet TAKE 2 TABLETS BY MOUTH TWICE DAILY WITH A MEAL (Patient taking differently: 2 tablets  in the Am and 1 tablet in the PM) 360 tablet 1   OZEMPIC , 1 MG/DOSE, 4 MG/3ML SOPN INJECT 1 ML AS DIRECTED ONCE A WEEK 9 mL 3   TOUJEO  SOLOSTAR 300 UNIT/ML Solostar Pen INJECT 20 UNITS SUBCUTANEOUSLY ONCE DAILY 9 mL 0   Vitamin D, Ergocalciferol, (DRISDOL) 1.25 MG (50000 UNIT) CAPS capsule Take 50,000 Units by mouth once a week.     No current facility-administered medications on file prior to visit.   Allergies  Allergen Reactions   Penicillins Hives    Tolerates keflex   Family History  Problem Relation Age of Onset   Hypertension Mother    Hyperlipidemia Mother    Diabetes Maternal Grandmother    Heart disease Maternal Grandfather    Hyperlipidemia Maternal Grandfather    PE: BP 136/80   Pulse 77   Ht 5' 0.25" (1.53 m)   Wt 213 lb 6.4 oz (96.8 kg)   LMP 11/12/2015   SpO2 93%   BMI 41.33 kg/m   Wt Readings from Last 3 Encounters:  09/26/23 213 lb 6.4 oz (96.8 kg)  03/31/23 212 lb 6.4 oz (96.3 kg)  03/16/23 215 lb 12.8 oz (97.9 kg)   Constitutional: overweight, in NAD Eyes: EOMI, no exophthalmos ENT: no thyromegaly, no cervical lymphadenopathy Cardiovascular: RRR, No MRG Respiratory: CTA B Musculoskeletal: no deformities Skin: no rashes Neurological: no tremor with outstretched hands Diabetic Foot Exam - Simple   Simple Foot Form Diabetic Foot exam was performed with the following findings: Yes 09/26/2023  12:00 PM  Visual Inspection No deformities, no ulcerations, no other skin breakdown bilaterally: Yes Sensation Testing Intact to touch and monofilament testing bilaterally: Yes Pulse Check Posterior Tibialis and Dorsalis pulse intact bilaterally: Yes Comments    ASSESSMENT: 1. DM2,  insulin -dependent, uncontrolled, without long-term complications, but with hyperglycemia  Cardiomegaly is considered to be related to hypertension.  No family history of medullary thyroid  cancer or personal history of pancreatitis.  2. Obesity class 3  3. HL  PLAN:  1. Patient with longstanding, uncontrolled, type 2 diabetes, with stable control at last visit-HbA1c 7.5%, consistent with prior.  At that time, sugars were mostly fluctuating within the target range, improved but with occasional higher blood sugars after dinner particularly, and less so after lunch.  We discussed about adding back glipizide  as needed before a larger meals, especially with the holidays coming up.  We did not change the rest of the regimen. CGM interpretation: -At today's visit, we reviewed her CGM downloads: It appears that 82% of values are in target range (goal >70%), while 18% are higher than 180 (goal <25%), and 0% are lower than 70 (goal <4%).  The calculated average blood sugar is 146.  The projected HbA1c for the next 3 months (GMI) is 6.8%. -Reviewing the CGM trends, sugars appear to be fluctuating mainly within the target range but with hyperglycemic spikes after lunch and then again after dinner.  Upon questioning, she did not obtain the glipizide  so she did not start this since last visit as advised, before larger meals.  We did discuss about starting this now and I sent a prescription to her pharmacy.  We also discussed about potentially switching from Ozempic  to Mounjaro but for now we decided to continue with the rest of the regimen with her working on diet and also exercise. - I suggested to:  Patient Instructions   Please continue: - Metformin  ER 1000 mg in am and 500 mg at night - Toujeo  20  units daily - Ozempic  0.5 mg weekly in a.m.   Please start: - Glipizide  5 mg before a larger meal  Please return in 4 months.  - we checked her HbA1c: 7.2% (lower) - advised to check sugars at different times of the day - 4x a day, rotating check times - advised for yearly eye exams >> she is UTD - return to clinic in 3-4 months  2. Obesity class 3 -She continues on Ozempic , which should also help with weight loss.  She could not tolerate the higher dose but she does well with 0.5 mg weekly dose. - She was is walking for exercise and now also starting to add weights - She lost 12 pounds before last visit, previously lost 9 - she gained 1 lb since last OV  3. HL - Latest lipid panel was reviewed from 02/2023: LDL slightly higher than goal, otherwise fractions at goal:  - On Lipitor 15 mg daily, increased after the above results returned.  She tolerates it well.  Emilie Harden, MD PhD [facility] Endocrinology

## 2023-10-09 NOTE — Telephone Encounter (Signed)
 Left message to call back  Trying to see if patient wants to do sleep study

## 2023-10-24 ENCOUNTER — Ambulatory Visit
Admission: RE | Admit: 2023-10-24 | Discharge: 2023-10-24 | Disposition: A | Discharge status: Home / Self Care | Source: Ambulatory Visit | Attending: Obstetrics and Gynecology | Admitting: Obstetrics and Gynecology

## 2023-10-24 DIAGNOSIS — Z1231 Encounter for screening mammogram for malignant neoplasm of breast: Secondary | ICD-10-CM

## 2023-11-27 ENCOUNTER — Other Ambulatory Visit: Payer: Self-pay | Admitting: Internal Medicine

## 2024-01-16 ENCOUNTER — Other Ambulatory Visit: Payer: Self-pay | Admitting: Internal Medicine

## 2024-01-16 DIAGNOSIS — E1165 Type 2 diabetes mellitus with hyperglycemia: Secondary | ICD-10-CM

## 2024-01-23 ENCOUNTER — Ambulatory Visit: Admitting: Internal Medicine

## 2024-02-22 DIAGNOSIS — R933 Abnormal findings on diagnostic imaging of other parts of digestive tract: Secondary | ICD-10-CM | POA: Diagnosis not present

## 2024-02-22 DIAGNOSIS — Z1211 Encounter for screening for malignant neoplasm of colon: Secondary | ICD-10-CM | POA: Diagnosis not present

## 2024-02-22 DIAGNOSIS — K760 Fatty (change of) liver, not elsewhere classified: Secondary | ICD-10-CM | POA: Diagnosis not present

## 2024-03-14 ENCOUNTER — Ambulatory Visit: Admitting: Internal Medicine

## 2024-03-21 DIAGNOSIS — E1169 Type 2 diabetes mellitus with other specified complication: Secondary | ICD-10-CM | POA: Diagnosis not present

## 2024-03-21 DIAGNOSIS — I10 Essential (primary) hypertension: Secondary | ICD-10-CM | POA: Diagnosis not present

## 2024-03-21 DIAGNOSIS — N1831 Chronic kidney disease, stage 3a: Secondary | ICD-10-CM | POA: Diagnosis not present

## 2024-03-21 DIAGNOSIS — E7800 Pure hypercholesterolemia, unspecified: Secondary | ICD-10-CM | POA: Diagnosis not present

## 2024-03-21 DIAGNOSIS — Z0000 Encounter for general adult medical examination without abnormal findings: Secondary | ICD-10-CM | POA: Diagnosis not present

## 2024-03-21 LAB — LAB REPORT - SCANNED
Albumin, Urine POC: 2.73
Creatinine, POC: 31 mg/dL
EGFR: 104
Microalb Creat Ratio: 89.5

## 2024-04-04 ENCOUNTER — Ambulatory Visit: Admitting: Internal Medicine

## 2024-04-04 ENCOUNTER — Encounter: Payer: Self-pay | Admitting: Internal Medicine

## 2024-04-04 VITALS — BP 128/70 | HR 87 | Ht 60.25 in | Wt 221.8 lb

## 2024-04-04 DIAGNOSIS — E66813 Obesity, class 3: Secondary | ICD-10-CM | POA: Diagnosis not present

## 2024-04-04 DIAGNOSIS — E785 Hyperlipidemia, unspecified: Secondary | ICD-10-CM | POA: Diagnosis not present

## 2024-04-04 DIAGNOSIS — Z794 Long term (current) use of insulin: Secondary | ICD-10-CM

## 2024-04-04 DIAGNOSIS — E1165 Type 2 diabetes mellitus with hyperglycemia: Secondary | ICD-10-CM

## 2024-04-04 MED ORDER — TOUJEO SOLOSTAR 300 UNIT/ML ~~LOC~~ SOPN: 30.0000 [IU] | PEN_INJECTOR | Freq: Every day | SUBCUTANEOUS | 3 refills | Status: AC

## 2024-04-04 MED ORDER — LYUMJEV KWIKPEN 100 UNIT/ML ~~LOC~~ SOPN
8.0000 [IU] | PEN_INJECTOR | Freq: Three times a day (TID) | SUBCUTANEOUS | 3 refills | Status: AC
Start: 2024-04-04 — End: ?

## 2024-04-04 NOTE — Patient Instructions (Addendum)
 Please continue: - Metformin  ER 1000 mg in am and 500 mg at night  Stop: - Glipizide   Increase: - Toujeo  30 units daily  Start: - Lyumjev 8-12 units before meals  Please return in 1.5 months.

## 2024-04-04 NOTE — Progress Notes (Signed)
 Patient ID: Colleen Henry, female   DOB: 1961/03/19, 63 y.o.   MRN: [id number]  HPI: Colleen Henry is a 63 y.o.-year-old female, returning for f/u for DM2, dx in ~2011, insulin -dependent since 10/2019, uncontrolled, without long-term complications. Last visit 6 months ago.  Interim history: She does have increased urination, which started in 01/2024.  No blurry vision, nausea, chest pain.   Reviewed HbA1c levels: Lab Results  Component Value Date   HGBA1C 7.2 (A) 09/26/2023   HGBA1C 7.5 11/25/2022   HGBA1C 8.6 (A) 07/29/2022  03/31/2023: HbA1c 7.5% 11/2014: HbA1c 9.9% 09/17/2014: HbA1c 12.2% 04/11/2014: HbA1c 8.6%  Pt was on a regimen of: - Metformin  XR 1000 mg bid - cannot remember what happened with reg metformin  - Farxiga  10 mg daily in am - started 07/2015 >> switched to Invokana  but ran out  - Biocell: Biotin, Zinc, Chromium She stopped Amaryl  - was taking 2 mg before b'fast Insulin  was suggested to her >> too expensive 300$/mo  Then on: - Metformin  ER 1000 mg 2x a day >> 1000 mg in am (not taking the evening dose in last 2 mo) - Toujeo  14 units daily  - added 10/2019 >> off 08/2020 >> restarted at 20 >>  (she forgot) - Ozempic  0.25 mg weekly-added 08/2020 >> 0.5 mg weekly Refused Trulicity  in the past, including 03/31/2020 Previously on glipizide  5 mg twice a day. Tresiba  was not covered for her.  We changed the regimen: - Metformin  ER 1000 mg 2x a day >> 1000 mg in am and 500 mg at night - Glipizide  5 mg before a larger meal  - Ozempic  0.5 >> 1  >> 0.5 mg weekly in a.m. (had to decrease the dose due to AP/vomiting/diarrhea) - Toujeo  24 >> 20 >> 28 >> 20 units daily  She was checking blood sugars with the freestyle libre CGM - 2 mo ago (see below), but did not attach the sensor and did not check blood sugars afterwards).  Previously:  Previously:  Previously:   Lowest sugar was 46 >> 200 >> ... 135 >> 70 >> ?; it is unclear at which CBG level she has  hypoglycemia awareness. Highest sugar was 380s >> .SABRA.170 >> upper 200s >> ?.  Glucometer: UltraTrak >> Easy Touch  -No CKD but she does have microalbuminuria.   Lab Results  Component Value Date   BUN 10 07/14/2021   CREATININE 0.45 07/14/2021   Lab Results  Component Value Date   MICRALBCREAT 89.5 03/21/2024  02/24/2021: ACR 109.5 03/31/2020: ACR 128.8 12/26/2019: Glucose 135, BUN/creatinine 13/0.46, GFR 140 10/06/2017: Glucose 126, BUN/creatinine 11/0.43, GFR 153, ACR 71.6 02/17/2016: ACR 66.8 07/31/2015: 14/0.41, eGFR 162 01/29/2015: ACR 31.6. On lisinopril .  -+ HL; last set of lipids:    Lab Results  Component Value Date   CHOL 203 (H) 02/28/2019   HDL 52.00 02/28/2019   LDLCALC 130 (H) 02/28/2019   TRIG 105.0 02/28/2019   CHOLHDL 4 02/28/2019   She initially refused statins. She is on Lipitor 10 >> 15 mg daily - started 09/2021 by Dr. Mona.  - last eye exam was 09/08/2023 + NPDR OS  She was getting IO injections -stopped during the pandemic.  - + numbness and tingling in her feet.  Last foot exam 09/26/2023.  She mentions that she recently had a health screen including a carotid Doppler.  She was told that she had an abnormality in her thyroid .  She does not have this result with her but will send it to  me.  A TSH was normal: 07/30//2024 - [facility]: TSH 0.65  ROS:  + See HPI  Past Medical History:  Diagnosis Date   Complication of anesthesia    agitation upon waking up as a child .    Depression    Diabetes mellitus    oral meds for diabetes . Type 2   Hypertension    Snores    Past Surgical History:  Procedure Laterality Date   BREAST CYST ASPIRATION     CESAREAN SECTION  01/16/1998   HERNIA REPAIR  2013   I & D EXTREMITY     INSERTION OF MESH  04/20/2012   Procedure: INSERTION OF MESH;  Surgeon: Elspeth KYM Schultze, MD;  Location: [facility] OR;  Service: General;  Laterality: N/A;   MASS EXCISION Right 04/19/2016   Procedure: RIGHT SMALL FINGER EXCISION  MASS;  Surgeon: Arley Curia, MD;  Location: [facility];  Service: Orthopedics;  Laterality: Right;   TONSILLECTOMY AND ADENOIDECTOMY  age 63   VENTRAL HERNIA REPAIR  04/20/2012   Procedure: LAPAROSCOPIC VENTRAL HERNIA;  Surgeon: Elspeth KYM Schultze, MD;  Location: [facility] OR;  Service: General;  Laterality: N/A;  laparoscopic ventral wall hernia repair   VENTRAL HERNIA REPAIR  04/20/2012   Procedure: HERNIA REPAIR VENTRAL ADULT;  Surgeon: Elspeth KYM Schultze, MD;  Location: [facility] OR;  Service: General;  Laterality: N/A;  Repair incarcerated ventral hernia times two.   History   Social History   Marital Status: Married    Spouse Name: N/A   Number of Children: 1   Occupational History    physiological scientist   Social History Main Topics   Smoking status: Never Smoker    Smokeless tobacco: Never Used   Alcohol Use: Yes     Comment: occasionally - socially -   Wine or vodka once or twice a month , 2-3 drinks   Drug Use: No   Current Outpatient Medications on File Prior to Visit  Medication Sig Dispense Refill   atorvastatin (LIPITOR) 10 MG tablet Take 15 mg by mouth at bedtime.     Continuous Glucose Sensor (FREESTYLE LIBRE 3 PLUS SENSOR) MISC Inject 1 Device into the skin continuous. Change every 15 days 6 each 3   escitalopram (LEXAPRO) 10 MG tablet Take 10 mg by mouth daily.     glipiZIDE  (GLUCOTROL ) 5 MG tablet Take 1 tablet (5 mg total) by mouth daily before supper. 90 tablet 3   lisinopril -hydrochlorothiazide  (PRINZIDE ,ZESTORETIC ) 20-25 MG tablet Take 1 tablet by mouth daily.     metFORMIN  (GLUCOPHAGE -XR) 500 MG 24 hr tablet 2 tablets in the Am and 1 tablet in the PM 270 tablet 2   OZEMPIC , 1 MG/DOSE, 4 MG/3ML SOPN INJECT 1 ML AS DIRECTED ONCE A WEEK 9 mL 3   TOUJEO  SOLOSTAR 300 UNIT/ML Solostar Pen INJECT 20 UNITS SUBCUTANEOUSLY ONCE DAILY 9 mL 0   Vitamin D, Ergocalciferol, (DRISDOL) 1.25 MG (50000 UNIT) CAPS capsule Take 50,000 Units by mouth once a week.     No current  facility-administered medications on file prior to visit.   Allergies  Allergen Reactions   Penicillins Hives    Tolerates keflex   Family History  Problem Relation Age of Onset   Hypertension Mother    Hyperlipidemia Mother    Diabetes Maternal Grandmother    Heart disease Maternal Grandfather    Hyperlipidemia Maternal Grandfather    PE: BP 128/70   Pulse 87   Ht 5' 0.25 (1.53 m)  Wt 221 lb 12.8 oz (100.6 kg)   LMP 11/12/2015   SpO2 93%   BMI 42.96 kg/m   Wt Readings from Last 3 Encounters:  04/04/24 221 lb 12.8 oz (100.6 kg)  09/26/23 213 lb 6.4 oz (96.8 kg)  03/31/23 212 lb 6.4 oz (96.3 kg)   Constitutional: overweight, in NAD Eyes: EOMI, no exophthalmos ENT: no thyromegaly, no cervical lymphadenopathy Cardiovascular: RRR, No MRG Respiratory: CTA B Musculoskeletal: no deformities Skin: no rashes Neurological: no tremor with outstretched hands  ASSESSMENT: 1. DM2,  insulin -dependent, uncontrolled, without long-term complications, but with hyperglycemia  Cardiomegaly is considered to be related to hypertension.  No family history of medullary thyroid  cancer or personal history of pancreatitis.  2. Obesity class 3  3. HL  PLAN:  1. Patient with longstanding, uncontrolled, type 2 diabetes, with slightly better control at last visit, but still with occasional elevated blood sugars after lunch and after dinner.  We discussed about continuing her metformin , basal insulin  and weekly GLP-1 receptor agonist but I advised her to add a low-dose sulfonylurea before a larger meal.We also discussed about potentially switching from Ozempic  to Mounjaro but we decided to continue with the rest of the regimen with her working on diet and also exercise. CGM interpretation: -At today's visit, we reviewed her CGM downloads from 2 months ago-sugars started to increase significantly: 31% of values are in target range (goal >70%), while 69% are higher than 180 (goal <25%), and 0% are  lower than 70 (goal <4%).  The calculated average blood sugar is 217.  The projected HbA1c for the next 3 months (GMI) is 8.5%. -Reviewing the CGM trends from 2 months ago, sugars were increasing as the day went by, with the largest increase after dinner and slowly decrease overnight.  I suspect that the same pattern is maintained now.  As of now, she is not checking sugars, but she does have a sensor with her which we held her attached today.  I strongly advised her to continue with it.  Due to the very high blood sugars (HbA1c today much higher see below), we will increase her Toujeo  dose, and start mealtime insulin .  I advised her how to vary the dose before each meal.  Will stop the glipizide  and continue metformin  for now.  Unfortunately, we have to continue without GLP-1 receptor agonist due to GI side effects. -After the sugars improved, I am wondering if we can give the incretin mimetic's another try -with a low-dose Mounjaro. - I suggested to:  Patient Instructions  Please continue: - Metformin  ER 1000 mg in am and 500 mg at night  Stop: - Glipizide   Increase: - Toujeo  30 units daily  Start: - Lyumjev 8-12 units before meals  Please return in 1.5 months.  - we checked her HbA1c today: 10.2% (much higher) - advised to check sugars at different times of the day - 4x a day, rotating check times - advised for yearly eye exams >> she is UTD - return to clinic in 4 months  2. Obesity class 3 - She stopped Ozempic  since last visit due to GI symptoms - she gained 1 lb before last visit and gained 8 pounds since then  3. HL - Reviewed latest lipid panel was reviewed from 02/2024: LDL slightly higher than goal, otherwise fractions at goal:   - Lipitor dose was increased last year to 15 mg daily-tolerated well  Lela Fendt, MD PhD [facility] Endocrinology

## 2024-04-05 DIAGNOSIS — D123 Benign neoplasm of transverse colon: Secondary | ICD-10-CM | POA: Diagnosis not present

## 2024-04-05 DIAGNOSIS — K635 Polyp of colon: Secondary | ICD-10-CM | POA: Diagnosis not present

## 2024-04-05 DIAGNOSIS — Z860100 Personal history of colon polyps, unspecified: Secondary | ICD-10-CM | POA: Diagnosis not present

## 2024-04-05 DIAGNOSIS — I10 Essential (primary) hypertension: Secondary | ICD-10-CM | POA: Diagnosis not present

## 2024-04-05 DIAGNOSIS — Z1211 Encounter for screening for malignant neoplasm of colon: Secondary | ICD-10-CM | POA: Diagnosis not present

## 2024-04-15 ENCOUNTER — Other Ambulatory Visit: Payer: Self-pay | Admitting: Gastroenterology

## 2024-04-15 DIAGNOSIS — R933 Abnormal findings on diagnostic imaging of other parts of digestive tract: Secondary | ICD-10-CM

## 2024-05-16 ENCOUNTER — Ambulatory Visit: Admitting: Internal Medicine

## 2024-06-03 ENCOUNTER — Encounter: Payer: Self-pay | Admitting: Gastroenterology

## 2024-06-05 ENCOUNTER — Ambulatory Visit
Admission: RE | Admit: 2024-06-05 | Discharge: 2024-06-05 | Disposition: A | Discharge status: Home / Self Care | Source: Ambulatory Visit | Attending: Gastroenterology | Admitting: Gastroenterology

## 2024-06-05 DIAGNOSIS — R933 Abnormal findings on diagnostic imaging of other parts of digestive tract: Secondary | ICD-10-CM

## 2024-06-05 MED ORDER — GADOPICLENOL 0.5 MMOL/ML IV SOLN
10.0000 mL | Freq: Once | INTRAVENOUS | Status: AC | PRN
  Administered 2024-06-05: 10 mL via INTRAVENOUS

## 2024-06-07 ENCOUNTER — Ambulatory Visit: Admitting: Internal Medicine

## 2024-08-02 ENCOUNTER — Ambulatory Visit: Admitting: Internal Medicine
